# Patient Record
Sex: Male | Born: 1945 | Race: White | Hispanic: No | Marital: Single | Smoking: Former smoker
Health system: Southern US, Community
[De-identification: ages and names within clinical notes are randomized; demographics above are authoritative.]

## PROBLEM LIST (undated history)

## (undated) DIAGNOSIS — K219 Gastro-esophageal reflux disease without esophagitis: Secondary | ICD-10-CM

## (undated) DIAGNOSIS — R0600 Dyspnea, unspecified: Secondary | ICD-10-CM

## (undated) DIAGNOSIS — I10 Essential (primary) hypertension: Secondary | ICD-10-CM

## (undated) DIAGNOSIS — E785 Hyperlipidemia, unspecified: Secondary | ICD-10-CM

## (undated) DIAGNOSIS — R011 Cardiac murmur, unspecified: Secondary | ICD-10-CM

## (undated) DIAGNOSIS — E782 Mixed hyperlipidemia: Secondary | ICD-10-CM

## (undated) DIAGNOSIS — R0683 Snoring: Secondary | ICD-10-CM

## (undated) DIAGNOSIS — J45909 Unspecified asthma, uncomplicated: Secondary | ICD-10-CM

## (undated) DIAGNOSIS — T7840XA Allergy, unspecified, initial encounter: Secondary | ICD-10-CM

## (undated) DIAGNOSIS — R0602 Shortness of breath: Secondary | ICD-10-CM

## (undated) DIAGNOSIS — I6529 Occlusion and stenosis of unspecified carotid artery: Secondary | ICD-10-CM

## (undated) DIAGNOSIS — H9319 Tinnitus, unspecified ear: Secondary | ICD-10-CM

## (undated) HISTORY — DX: Essential (primary) hypertension: I10

## (undated) HISTORY — DX: Hyperlipidemia, unspecified: E78.5

## (undated) HISTORY — DX: Dyspnea, unspecified: R06.00

## (undated) HISTORY — DX: Shortness of breath: R06.02

## (undated) HISTORY — PX: CARDIAC CATHETERIZATION: SHX172

## (undated) HISTORY — DX: Allergy, unspecified, initial encounter: T78.40XA

## (undated) HISTORY — DX: Tinnitus, unspecified ear: H93.19

## (undated) HISTORY — DX: Snoring: R06.83

## (undated) HISTORY — DX: Mixed hyperlipidemia: E78.2

## (undated) HISTORY — PX: NO PAST SURGERIES: SHX2092

## (undated) HISTORY — DX: Occlusion and stenosis of unspecified carotid artery: I65.29

---

## 2015-11-01 DIAGNOSIS — R030 Elevated blood-pressure reading, without diagnosis of hypertension: Secondary | ICD-10-CM | POA: Insufficient documentation

## 2016-02-01 DIAGNOSIS — M109 Gout, unspecified: Secondary | ICD-10-CM | POA: Insufficient documentation

## 2017-04-01 DIAGNOSIS — G609 Hereditary and idiopathic neuropathy, unspecified: Secondary | ICD-10-CM | POA: Insufficient documentation

## 2017-06-12 DIAGNOSIS — R600 Localized edema: Secondary | ICD-10-CM | POA: Insufficient documentation

## 2019-12-23 DIAGNOSIS — E785 Hyperlipidemia, unspecified: Secondary | ICD-10-CM | POA: Insufficient documentation

## 2019-12-23 DIAGNOSIS — E782 Mixed hyperlipidemia: Secondary | ICD-10-CM | POA: Insufficient documentation

## 2019-12-23 DIAGNOSIS — H9319 Tinnitus, unspecified ear: Secondary | ICD-10-CM | POA: Insufficient documentation

## 2019-12-23 DIAGNOSIS — R0683 Snoring: Secondary | ICD-10-CM | POA: Insufficient documentation

## 2019-12-23 DIAGNOSIS — R0602 Shortness of breath: Secondary | ICD-10-CM | POA: Insufficient documentation

## 2019-12-23 DIAGNOSIS — R0600 Dyspnea, unspecified: Secondary | ICD-10-CM | POA: Insufficient documentation

## 2019-12-23 DIAGNOSIS — I10 Essential (primary) hypertension: Secondary | ICD-10-CM | POA: Insufficient documentation

## 2019-12-28 ENCOUNTER — Other Ambulatory Visit: Payer: Self-pay

## 2019-12-28 ENCOUNTER — Other Ambulatory Visit: Payer: Self-pay | Admitting: *Deleted

## 2019-12-28 ENCOUNTER — Ambulatory Visit: Payer: Medicare PPO | Admitting: Cardiology

## 2019-12-28 ENCOUNTER — Encounter: Payer: Self-pay | Admitting: Cardiology

## 2019-12-28 VITALS — BP 104/62 | HR 85 | Ht 69.0 in | Wt 247.0 lb

## 2019-12-28 DIAGNOSIS — I10 Essential (primary) hypertension: Secondary | ICD-10-CM

## 2019-12-28 DIAGNOSIS — R0600 Dyspnea, unspecified: Secondary | ICD-10-CM | POA: Diagnosis not present

## 2019-12-28 DIAGNOSIS — R0989 Other specified symptoms and signs involving the circulatory and respiratory systems: Secondary | ICD-10-CM

## 2019-12-28 DIAGNOSIS — R079 Chest pain, unspecified: Secondary | ICD-10-CM

## 2019-12-28 DIAGNOSIS — I200 Unstable angina: Secondary | ICD-10-CM

## 2019-12-28 DIAGNOSIS — R011 Cardiac murmur, unspecified: Secondary | ICD-10-CM

## 2019-12-28 DIAGNOSIS — E782 Mixed hyperlipidemia: Secondary | ICD-10-CM

## 2019-12-28 DIAGNOSIS — R0602 Shortness of breath: Secondary | ICD-10-CM

## 2019-12-28 DIAGNOSIS — R0609 Other forms of dyspnea: Secondary | ICD-10-CM

## 2019-12-28 HISTORY — DX: Other specified symptoms and signs involving the circulatory and respiratory systems: R09.89

## 2019-12-28 HISTORY — DX: Chest pain, unspecified: R07.9

## 2019-12-28 MED ORDER — METOPROLOL TARTRATE 100 MG PO TABS: ORAL_TABLET | ORAL | 0 refills | Status: DC

## 2019-12-28 NOTE — Patient Instructions (Addendum)
Medication Instructions:  Your physician recommends that you continue on your current medications as directed. Please refer to the Current Medication list given to you today.  *If you need a refill on your cardiac medications before your next appointment, please call your pharmacy*   Lab Work: When they call you with a date for your Cardiac Ct, you will need to come to the office at least 3 days prior to your CT for a BMET   If you have labs (blood work) drawn today and your tests are completely normal, you will receive your results only by: Marland Kitchen MyChart Message (if you have MyChart) OR . A paper copy in the mail If you have any lab test that is abnormal or we need to change your treatment, we will call you to review the results.   Testing/Procedures: Your physician has requested that you have an echocardiogram. Echocardiography is a painless test that uses sound waves to create images of your heart. It provides your doctor with information about the size and shape of your heart and how well your heart's chambers and valves are working. This procedure takes approximately one hour. There are no restrictions for this procedure.  Your physician has requested that you have a carotid duplex. This test is an ultrasound of the carotid arteries in your neck. It looks at blood flow through these arteries that supply the brain with blood. Allow one hour for this exam. There are no restrictions or special instructions.  Your physician has requested that you have cardiac CT. Cardiac computed tomography (CT) is a painless test that uses an x-ray machine to take clear, detailed pictures of your heart. For further information please visit [email or web address]. Please follow instruction sheet as given.    Your cardiac CT will be scheduled at one of the below locations:   [facility] [address] [place], [place] [address] [phone]   At [facility], please arrive at the [facility] main entrance of [facility] 30 minutes prior to test start time. Proceed to the [facility] Radiology Department (first floor) to check-in and test prep.   Please follow these instructions carefully (unless otherwise directed):  Hold all erectile dysfunction medications at least 3 days (72 hrs) prior to test.  On the Night Before the Test: . Be sure to Drink plenty of water. . Do not consume any caffeinated/decaffeinated beverages or chocolate 12 hours prior to your test. . Do not take any antihistamines 12 hours prior to your test.   On the Day of the Test: . Drink plenty of water. Do not drink any water within one hour of the test. . Do not eat any food 4 hours prior to the test. . You may take your regular medications prior to the test.  . Take metoprolol (Lopressor)100 mg tablet two hours prior to test (this has been sent to [facility] for you to pick up . HOLD Hydrochlorothiazide morning of the test.       After the Test: . Drink plenty of water. . After receiving IV contrast, you may experience a mild flushed feeling. This is normal. . On occasion, you may experience a mild rash up to 24 hours after the test. This is not dangerous. If this occurs, you can take Benadryl 25 mg and increase your fluid intake. . If you experience trouble breathing, this can be serious. If it is severe call 911 IMMEDIATELY. If it is mild, please call our office. . If  you take any of these medications: Glipizide/Metformin, Avandament, Glucavance, please do not take 48 hours after completing test unless otherwise instructed.   Once we have confirmed authorization from your insurance company, we will call you to set up a date and time for your test. Based on how quickly your insurance processes prior authorizations requests, please allow up to 4 weeks to be contacted for scheduling your Cardiac CT appointment. Be advised that routine Cardiac CT appointments could be scheduled as many as 8 weeks  after your provider has ordered it.  For non-scheduling related questions, please contact the cardiac imaging nurse navigator should you have any questions/concerns: Marchia Bond, Cardiac Imaging Nurse Navigator Burley Saver, Interim Cardiac Imaging Nurse [place] [facility] Direct Office Dial: [phone]   For scheduling needs, including cancellations and rescheduling, please call Vivien Rota at [phone], option 3.  Follow-Up: At [facility], you and your health needs are our priority.  As part of our continuing mission to provide you with exceptional heart care, we have created designated Provider Care Teams.  These Care Teams include your primary Cardiologist (physician) and Advanced Practice Providers (APPs -  Physician Assistants and Nurse Practitioners) who all work together to provide you with the care you need, when you need it.  We recommend signing up for the patient portal called "MyChart".  Sign up information is provided on this After Visit Summary.  MyChart is used to connect with patients for Virtual Visits (Telemedicine).  Patients are able to view lab/test results, encounter notes, upcoming appointments, etc.  Non-urgent messages can be sent to your provider as well.   To learn more about what you can do with MyChart, go to [email or web address].    Your next appointment:   6 week(s)  The format for your next appointment:   In Person  Provider:   Jenne Campus, MD   Other Instructions  Echocardiogram An echocardiogram is a procedure that uses painless sound waves (ultrasound) to produce an image of the heart. Images from an echocardiogram can provide important information about:  Signs of coronary artery disease (CAD).  Aneurysm detection. An aneurysm is a weak or damaged part of an artery wall that bulges out from the normal force of blood pumping through the body.  Heart size and shape. Changes in the size or shape of the heart  can be associated with certain conditions, including heart failure, aneurysm, and CAD.  Heart muscle function.  Heart valve function.  Signs of a past heart attack.  Fluid buildup around the heart.  Thickening of the heart muscle.  A tumor or infectious growth around the heart valves. Tell a health care provider about:  Any allergies you have.  All medicines you are taking, including vitamins, herbs, eye drops, creams, and over-the-counter medicines.  Any blood disorders you have.  Any surgeries you have had.  Any medical conditions you have.  Whether you are pregnant or may be pregnant. What are the risks? Generally, this is a safe procedure. However, problems may occur, including:  Allergic reaction to dye (contrast) that may be used during the procedure. What happens before the procedure? No specific preparation is needed. You may eat and drink normally. What happens during the procedure?   An IV tube may be inserted into one of your veins.  You may receive contrast through this tube. A contrast is an injection that improves the quality of the pictures from your heart.  A gel will be applied to your chest.  A wand-like tool (transducer) will be moved over your chest. The gel will help to transmit the sound waves from the transducer.  The sound waves will harmlessly bounce off of your heart to allow the heart images to be captured in real-time motion. The images will be recorded on a computer. The procedure may vary among health care providers and hospitals. What happens after the procedure?  You may return to your normal, everyday life, including diet, activities, and medicines, unless your health care provider tells you not to do that. Summary  An echocardiogram is a procedure that uses painless sound waves (ultrasound) to produce an image of the heart.  Images from an echocardiogram can provide important information about the size and shape of your heart,  heart muscle function, heart valve function, and fluid buildup around your heart.  You do not need to do anything to prepare before this procedure. You may eat and drink normally.  After the echocardiogram is completed, you may return to your normal, everyday life, unless your health care provider tells you not to do that. This information is not intended to replace advice given to you by your health care provider. Make sure you discuss any questions you have with your health care provider. Document Revised: 08/14/2018 Document Reviewed: 05/26/2016 Elsevier Patient Education  [place].

## 2019-12-28 NOTE — Progress Notes (Signed)
Cardiology Consultation:    Date:  12/28/2019   ID:  Russell Butler 29-Nov-1945, MRN [id number]  PCP:  [place], Sarah T, MD  Cardiologist:  Gypsy Balsam, MD   Referring MD: [place], Sarah T, MD   Chief Complaint  Patient presents with  . Shortness of Breath    Saw chodrey  . Chest Pain    History of Present Illness:    Russell Butler is a 74 y.o. male who is being seen today for the evaluation of chest pain at the request of [place], Sarah T, MD. For what 6 months he experienced fatigue tiredness and shortness of breath. He said anytime he goes to mailbox and comes back he gets significantly short of breath, also sometimes when he walks uphill he will develop some uneasy sensation in the chest he sits in the chair and a few minutes later the sensation is gone. He does have history of essential hypertension, also many years ago he was evaluated by Dr.Dhatt who sent him to [facility] where he had cardiac catheterization was told everything is fine. Risk factors for coronary artery disease include dyslipidemia, essential hypertension. He smoked but quit 40 years ago No family history of premature coronary disease  Past Medical History:  Diagnosis Date  . Dyspnea   . Essential hypertension   . Hyperlipidemia   . Hypertension   . Mixed hyperlipidemia   . Snoring   . SOB (shortness of breath)   . Tinnitus     Past Surgical History:  Procedure Laterality Date  . NO PAST SURGERIES      Current Medications: Current Meds  Medication Sig  . albuterol (VENTOLIN HFA) 108 (90 Base) MCG/ACT inhaler   . atorvastatin (LIPITOR) 20 MG tablet Take 20 mg by mouth daily.  Marland Kitchen BREO ELLIPTA 100-25 MCG/INH AEPB   . hydrochlorothiazide (HYDRODIURIL) 25 MG tablet Take 25 mg by mouth daily.  Marland Kitchen lisinopril (ZESTRIL) 40 MG tablet Take 40 mg by mouth daily.     Allergies:   Patient has no known allergies.   Social History   Socioeconomic History  . Marital status: Unknown    Spouse  name: Not on file  . Number of children: Not on file  . Years of education: Not on file  . Highest education level: Not on file  Occupational History  . Not on file  Tobacco Use  . Smoking status: Former Smoker    Types: Cigarettes    Quit date: 12/28/1979    Years since quitting: 40.0  . Smokeless tobacco: Never Used  Substance and Sexual Activity  . Alcohol use: Yes    Alcohol/week: 4.0 standard drinks    Types: 4 Glasses of wine per week  . Drug use: Never  . Sexual activity: Not on file  Other Topics Concern  . Not on file  Social History Narrative  . Not on file   Social Determinants of Health   Financial Resource Strain:   . Difficulty of Paying Living Expenses: Not on file  Food Insecurity:   . Worried About Programme researcher, broadcasting/film/video in the Last Year: Not on file  . Ran Out of Food in the Last Year: Not on file  Transportation Needs:   . Lack of Transportation (Medical): Not on file  . Lack of Transportation (Non-Medical): Not on file  Physical Activity:   . Days of Exercise per Week: Not on file  . Minutes of Exercise per Session: Not on file  Stress:   .  Feeling of Stress : Not on file  Social Connections:   . Frequency of Communication with Friends and Family: Not on file  . Frequency of Social Gatherings with Friends and Family: Not on file  . Attends Religious Services: Not on file  . Active Member of Clubs or Organizations: Not on file  . Attends Banker Meetings: Not on file  . Marital Status: Not on file     Family History: The patient's family history includes Cancer in his paternal grandfather; Depression in his mother; Diabetes in his maternal grandfather; Hypercholesterolemia in his brother; Hypertension in his brother; Liver disease in his father. ROS:   Please see the history of present illness.    All 14 point review of systems negative except as described per history of present illness.  EKGs/Labs/Other Studies Reviewed:    The  following studies were reviewed today:   EKG:  EKG is  ordered today.  The ekg ordered today demonstrates normal sinus rhythm, normal P interval, nonspecific ST segment changes in multiple leads. He is asymptomatic at the moment  Recent Labs: No results found for requested labs within last 8760 hours.  Recent Lipid Panel No results found for: CHOL, TRIG, HDL, CHOLHDL, VLDL, LDLCALC, LDLDIRECT  Physical Exam:    VS:  BP 104/62 (BP Location: Left Arm, Patient Position: Sitting, Cuff Size: Large)   Pulse 85   Ht 5\' 9"  (1.753 m)   Wt 247 lb (112 kg)   SpO2 98%   BMI 36.48 kg/m     Wt Readings from Last 3 Encounters:  12/28/19 247 lb (112 kg)     GEN:  Well nourished, well developed in no acute distress HEENT: Normal NECK: No JVD; bilateral carotic bruit LYMPHATICS: No lymphadenopathy CARDIAC: RRR, systolic ejection murmur mid peaking grade 2/6 best heard right upper portion of the sternum with radiation towards the neck, no rubs, no gallops RESPIRATORY:  Clear to auscultation without rales, wheezing or rhonchi  ABDOMEN: Soft, non-tender, non-distended MUSCULOSKELETAL:  No edema; No deformity  SKIN: Warm and dry NEUROLOGIC:  Alert and oriented x 3 PSYCHIATRIC:  Normal affect   ASSESSMENT:    1. Essential hypertension   2. Dyspnea on exertion   3. Mixed hyperlipidemia   4. SOB (shortness of breath)   5. Unstable angina pectoris (HCC)   6. Bruit   7. Chest pain of uncertain etiology   8. Heart murmur    PLAN:    In order of problems listed above:  1. Dyspnea on exertion with some chest pain obviously very concerning. Initial impression was that he may be having significant aortic stenosis, I did quick look with echocardiogram luckily that is not the case. Therefore, we need to evaluate him for coronary artery disease. I think the best approach will be to do coronary CT angio. I will also schedule him to have full echocardiogram to make sure we have specific assessment of  his aortic valve as well as overall left ventricle ejection fraction.  He does have nitroglycerin that he never used but he knows how to use it also told him if nitroglycerin does not help with the pain he is to go to the emergency room. 2. Bilateral carotic bruit carotic ultrasound will be done to clarify that. 3. Mixed dyslipidemia he is on moderate intensity statin which I will continue for now however future recommendation can change. 4.  Aortic stenosis, appears to be not critical based on quick look with echocardiogram, full assessment will  be done with full echocardiogram.   Medication Adjustments/Labs and Tests Ordered: Current medicines are reviewed at length with the patient today.  Concerns regarding medicines are outlined above.  Orders Placed This Encounter  Procedures  . CT CORONARY MORPH W/CTA COR W/SCORE W/CA W/CM &/OR WO/CM  . CT CORONARY FRACTIONAL FLOW RESERVE DATA PREP  . CT CORONARY FRACTIONAL FLOW RESERVE FLUID ANALYSIS  . EKG 12-Lead  . VAS US CAROTID   No orders of the defined types were placed in this encounter.   Signed, Georgeanna Lea, MD, [facility]. 12/28/2019 3:01 PM    [facility]

## 2020-01-18 ENCOUNTER — Other Ambulatory Visit: Payer: Self-pay

## 2020-01-18 ENCOUNTER — Ambulatory Visit (INDEPENDENT_AMBULATORY_CARE_PROVIDER_SITE_OTHER): Payer: Medicare PPO

## 2020-01-18 DIAGNOSIS — R0989 Other specified symptoms and signs involving the circulatory and respiratory systems: Secondary | ICD-10-CM

## 2020-01-18 DIAGNOSIS — R011 Cardiac murmur, unspecified: Secondary | ICD-10-CM | POA: Diagnosis not present

## 2020-01-18 LAB — ECHOCARDIOGRAM COMPLETE
AR max vel: 1.83 cm2
AV Area VTI: 2.1 cm2
AV Area mean vel: 1.91 cm2
AV Mean grad: 11 mmHg
AV Peak grad: 20.6 mmHg
Ao pk vel: 2.27 m/s
Area-P 1/2: 3.08 cm2
S' Lateral: 3.7 cm

## 2020-01-18 NOTE — Progress Notes (Signed)
Complete echocardiogram performed.  Jimmy Virdia Ziesmer RDCS, RVT  

## 2020-01-18 NOTE — Progress Notes (Addendum)
Complete Carotid duplex exam performed.  Jimmy Markeith Jue RDCS, RVT

## 2020-01-27 ENCOUNTER — Telehealth: Payer: Self-pay | Admitting: Cardiology

## 2020-01-27 NOTE — Telephone Encounter (Signed)
Patient stopped by asking about CT scan. Stated he has received an Multimedia programmer from his insurance. Needs to be scheduled for CT. I do not see the auth in the patient chart.

## 2020-02-24 ENCOUNTER — Other Ambulatory Visit: Payer: Self-pay

## 2020-02-24 ENCOUNTER — Encounter: Payer: Self-pay | Admitting: Cardiology

## 2020-02-24 ENCOUNTER — Ambulatory Visit: Payer: Medicare PPO | Admitting: Cardiology

## 2020-02-24 VITALS — BP 106/70 | HR 70 | Ht 68.0 in | Wt 243.4 lb

## 2020-02-24 DIAGNOSIS — R0989 Other specified symptoms and signs involving the circulatory and respiratory systems: Secondary | ICD-10-CM | POA: Diagnosis not present

## 2020-02-24 DIAGNOSIS — R0609 Other forms of dyspnea: Secondary | ICD-10-CM

## 2020-02-24 DIAGNOSIS — R0600 Dyspnea, unspecified: Secondary | ICD-10-CM

## 2020-02-24 DIAGNOSIS — I10 Essential (primary) hypertension: Secondary | ICD-10-CM | POA: Diagnosis not present

## 2020-02-24 DIAGNOSIS — R079 Chest pain, unspecified: Secondary | ICD-10-CM

## 2020-02-24 DIAGNOSIS — E782 Mixed hyperlipidemia: Secondary | ICD-10-CM | POA: Diagnosis not present

## 2020-02-24 DIAGNOSIS — I739 Peripheral vascular disease, unspecified: Secondary | ICD-10-CM

## 2020-02-24 HISTORY — DX: Peripheral vascular disease, unspecified: I73.9

## 2020-02-24 NOTE — Progress Notes (Signed)
Cardiology Office Note:    Date:  02/24/2020   ID:  Russell Butler 1945/07/02, MRN [id number]  PCP:  [place], Sarah T, MD  Cardiologist:  Gypsy Balsam, MD    Referring MD: [place], Sarah T, MD   No chief complaint on file. I am doing the same  History of Present Illness:    Russell Butler is a 74 y.o. male with past medical history significant for peripheral vascular disease, recent carotic ultrasound showed up to 79% stenosis of both sides, essential hypertension, dyslipidemia, was referred to [place] initially because of dyspnea on exertion on top of that sometimes when he exercise walk he will develop some uneasy sensation in the chest.  Plan was to schedule to have coronary CT angio, however, there is delay by approval by insurance company and we still waiting for the test.  Overall he said he is doing the same.  Recently addition of hydrochlorothiazide has been made to his medical regimen secondary to the fact he was hypertensive.  Blood pressure seems to be well controlled with now.  Denies having atypical chest pain tightness squeezing pressure burning chest but shortness of breath and fatigue is still present.  Past Medical History:  Diagnosis Date  . Dyspnea   . Essential hypertension   . Hyperlipidemia   . Hypertension   . Mixed hyperlipidemia   . Snoring   . SOB (shortness of breath)   . Tinnitus     Past Surgical History:  Procedure Laterality Date  . NO PAST SURGERIES      Current Medications: Current Meds  Medication Sig  . albuterol (VENTOLIN HFA) 108 (90 Base) MCG/ACT inhaler   . aspirin EC 81 MG tablet Take 81 mg by mouth daily. Swallow whole.  Marland Kitchen atorvastatin (LIPITOR) 20 MG tablet Take 20 mg by mouth daily.  Marland Kitchen BREO ELLIPTA 100-25 MCG/INH AEPB   . hydrochlorothiazide (HYDRODIURIL) 25 MG tablet Take 25 mg by mouth daily.  Marland Kitchen lisinopril (ZESTRIL) 40 MG tablet Take 40 mg by mouth daily.  . metoprolol tartrate (LOPRESSOR) 100 MG tablet Take 1 tablet by mouth  2 hour prior to your Cardiac Ct     Allergies:   Patient has no known allergies.   Social History   Socioeconomic History  . Marital status: Unknown    Spouse name: Not on file  . Number of children: Not on file  . Years of education: Not on file  . Highest education level: Not on file  Occupational History  . Not on file  Tobacco Use  . Smoking status: Former Smoker    Types: Cigarettes    Quit date: 12/28/1979    Years since quitting: 40.1  . Smokeless tobacco: Never Used  Substance and Sexual Activity  . Alcohol use: Yes    Alcohol/week: 4.0 standard drinks    Types: 4 Glasses of wine per week  . Drug use: Never  . Sexual activity: Not on file  Other Topics Concern  . Not on file  Social History Narrative  . Not on file   Social Determinants of Health   Financial Resource Strain:   . Difficulty of Paying Living Expenses: Not on file  Food Insecurity:   . Worried About Programme researcher, broadcasting/film/video in the Last Year: Not on file  . Ran Out of Food in the Last Year: Not on file  Transportation Needs:   . Lack of Transportation (Medical): Not on file  . Lack of Transportation (Non-Medical): Not on file  Physical Activity:   . Days of Exercise per Week: Not on file  . Minutes of Exercise per Session: Not on file  Stress:   . Feeling of Stress : Not on file  Social Connections:   . Frequency of Communication with Friends and Family: Not on file  . Frequency of Social Gatherings with Friends and Family: Not on file  . Attends Religious Services: Not on file  . Active Member of Clubs or Organizations: Not on file  . Attends Banker Meetings: Not on file  . Marital Status: Not on file     Family History: The patient's family history includes Cancer in his paternal grandfather; Depression in his mother; Diabetes in his maternal grandfather; Hypercholesterolemia in his brother; Hypertension in his brother; Liver disease in his father. ROS:   Please see the history  of present illness.    All 14 point review of systems negative except as described per history of present illness  EKGs/Labs/Other Studies Reviewed:      Recent Labs: No results found for requested labs within last 8760 hours.  Recent Lipid Panel No results found for: CHOL, TRIG, HDL, CHOLHDL, VLDL, LDLCALC, LDLDIRECT  Physical Exam:    VS:  BP 106/70   Pulse 70   Ht 5\' 8"  (1.727 m)   Wt 243 lb 6.4 oz (110.4 kg)   SpO2 98%   BMI 37.01 kg/m     Wt Readings from Last 3 Encounters:  02/24/20 243 lb 6.4 oz (110.4 kg)  12/28/19 247 lb (112 kg)     GEN:  Well nourished, well developed in no acute distress HEENT: Normal NECK: No JVD; No carotid bruits LYMPHATICS: No lymphadenopathy CARDIAC: RRR, no murmurs, no rubs, no gallops RESPIRATORY:  Clear to auscultation without rales, wheezing or rhonchi  ABDOMEN: Soft, non-tender, non-distended MUSCULOSKELETAL:  No edema; No deformity  SKIN: Warm and dry LOWER EXTREMITIES: no swelling NEUROLOGIC:  Alert and oriented x 3 PSYCHIATRIC:  Normal affect   ASSESSMENT:    1. Mixed hyperlipidemia   2. Essential hypertension   3. Dyspnea on exertion   4. Bilateral carotid bruits   5. Chest pain of uncertain etiology   6. Peripheral vascular disease, unspecified (HCC) up to 79% p both sides carotids    PLAN:    In order of problems listed above:  1. Mixed dyslipidemia I will check his fasting lipid profile today.  He does have peripheral vascular disease he need to have good reduction of cholesterol which we will check. 2. Essential hypertension: That seems to be stable I will continue present management. 3. Dyspnea on exertion: Echocardiogram showed preserved left ventricle ejection fraction, mild aortic valve stenosis. 4. Bilateral carotic bruit again carotic ultrasounds revealed stenosis up to 79% both sides.  He is on aspirin which I will continue will maximize his statin therapy but first we will check his fasting lipid  profile. 5. Awaiting evaluation for coronary artery disease, finally, we have approval by insurance company we will schedule him to have coronary CT.   Medication Adjustments/Labs and Tests Ordered: Current medicines are reviewed at length with the patient today.  Concerns regarding medicines are outlined above.  Orders Placed This Encounter  Procedures  . Lipid panel   Medication changes: No orders of the defined types were placed in this encounter.   Signed, 12/30/19, MD, [facility] 02/24/2020 2:57 PM    [facility]

## 2020-02-24 NOTE — Patient Instructions (Signed)
Medication Instructions:  Your physician recommends that you continue on your current medications as directed. Please refer to the Current Medication list given to you today.  *If you need a refill on your cardiac medications before your next appointment, please call your pharmacy*   Lab Work: Your physician recommends that you return for lab work in: lipid   If you have labs (blood work) drawn today and your tests are completely normal, you will receive your results only by: Marland Kitchen MyChart Message (if you have MyChart) OR . A paper copy in the mail If you have any lab test that is abnormal or we need to change your treatment, we will call you to review the results.   Testing/Procedures: None   Follow-Up: At [facility], you and your health needs are our priority.  As part of our continuing mission to provide you with exceptional heart care, we have created designated Provider Care Teams.  These Care Teams include your primary Cardiologist (physician) and Advanced Practice Providers (APPs -  Physician Assistants and Nurse Practitioners) who all work together to provide you with the care you need, when you need it.  We recommend signing up for the patient portal called "MyChart".  Sign up information is provided on this After Visit Summary.  MyChart is used to connect with patients for Virtual Visits (Telemedicine).  Patients are able to view lab/test results, encounter notes, upcoming appointments, etc.  Non-urgent messages can be sent to your provider as well.   To learn more about what you can do with MyChart, go to [email or web address].    Your next appointment:   2 month(s)  The format for your next appointment:   In Person  Provider:   Gypsy Balsam, MD   Other Instructions

## 2020-02-25 ENCOUNTER — Telehealth: Payer: Self-pay | Admitting: Emergency Medicine

## 2020-02-25 DIAGNOSIS — E782 Mixed hyperlipidemia: Secondary | ICD-10-CM

## 2020-02-25 LAB — LIPID PANEL
Chol/HDL Ratio: 3.4 ratio (ref 0.0–5.0)
Cholesterol, Total: 172 mg/dL (ref 100–199)
HDL: 51 mg/dL (ref >39)
LDL Chol Calc (NIH): 91 mg/dL (ref 0–99)
Triglycerides: 172 mg/dL — ABNORMAL HIGH (ref 0–149)
VLDL Cholesterol Cal: 30 mg/dL (ref 5–40)

## 2020-02-25 MED ORDER — ATORVASTATIN CALCIUM 40 MG PO TABS
40.0000 mg | ORAL_TABLET | Freq: Every day | ORAL | 1 refills | Status: DC
Start: 2020-02-25 — End: 2020-03-24

## 2020-02-25 NOTE — Telephone Encounter (Signed)
-----   Message from Georgeanna Lea, MD sent at 02/25/2020 12:33 PM EDT ----- Increase the dose of Lipitor from 20 to 40 mg daily, fasting lipid profile AST ALT within the next 6 weeks

## 2020-02-25 NOTE — Telephone Encounter (Signed)
Called patient informed him of results and recommendations. He will increase lipitor to 40 mg and have labs repeated in 6 weeks.

## 2020-03-08 ENCOUNTER — Other Ambulatory Visit: Payer: Self-pay | Admitting: Cardiology

## 2020-03-09 ENCOUNTER — Ambulatory Visit (HOSPITAL_COMMUNITY): Payer: Medicare PPO

## 2020-03-09 LAB — BASIC METABOLIC PANEL
BUN/Creatinine Ratio: 13 (ref 10–24)
BUN: 18 mg/dL (ref 8–27)
CO2: 25 mmol/L (ref 20–29)
Calcium: 9.8 mg/dL (ref 8.6–10.2)
Chloride: 101 mmol/L (ref 96–106)
Creatinine, Ser: 1.36 mg/dL — ABNORMAL HIGH (ref 0.76–1.27)
GFR calc Af Amer: 59 mL/min/{1.73_m2} — ABNORMAL LOW (ref >59)
GFR calc non Af Amer: 51 mL/min/{1.73_m2} — ABNORMAL LOW (ref >59)
Glucose: 102 mg/dL — ABNORMAL HIGH (ref 65–99)
Potassium: 4.4 mmol/L (ref 3.5–5.2)
Sodium: 138 mmol/L (ref 134–144)

## 2020-03-10 ENCOUNTER — Telehealth (HOSPITAL_COMMUNITY): Payer: Self-pay | Admitting: Emergency Medicine

## 2020-03-10 NOTE — Telephone Encounter (Signed)
Reaching out to patient to offer assistance regarding upcoming cardiac imaging study; pt verbalizes understanding of appt date/time, parking situation and where to check in, pre-test NPO status and medications ordered, and verified current allergies; name and call back number provided for further questions should they arise Jamier Urbas RN Navigator Cardiac Imaging [facility] [phone] office [phone] cell 

## 2020-03-14 ENCOUNTER — Other Ambulatory Visit: Payer: Self-pay

## 2020-03-14 ENCOUNTER — Ambulatory Visit (HOSPITAL_COMMUNITY)
Admission: RE | Admit: 2020-03-14 | Discharge: 2020-03-14 | Disposition: A | Discharge status: Home / Self Care | Payer: Medicare PPO | Source: Ambulatory Visit | Attending: Cardiology | Admitting: Cardiology

## 2020-03-14 ENCOUNTER — Encounter (HOSPITAL_COMMUNITY): Payer: Self-pay

## 2020-03-14 DIAGNOSIS — I2510 Atherosclerotic heart disease of native coronary artery without angina pectoris: Secondary | ICD-10-CM | POA: Diagnosis not present

## 2020-03-14 DIAGNOSIS — I200 Unstable angina: Secondary | ICD-10-CM | POA: Diagnosis present

## 2020-03-14 MED ORDER — IOHEXOL 350 MG/ML SOLN
100.0000 mL | Freq: Once | INTRAVENOUS | Status: AC | PRN
  Administered 2020-03-14: 100 mL via INTRAVENOUS

## 2020-03-14 MED ORDER — NITROGLYCERIN 0.4 MG SL SUBL
SUBLINGUAL_TABLET | SUBLINGUAL | Status: AC
  Filled 2020-03-14: qty 2

## 2020-03-14 MED ORDER — NITROGLYCERIN 0.4 MG SL SUBL
0.8000 mg | SUBLINGUAL_TABLET | Freq: Once | SUBLINGUAL | Status: AC
  Administered 2020-03-14: 0.8 mg via SUBLINGUAL

## 2020-03-15 DIAGNOSIS — I2510 Atherosclerotic heart disease of native coronary artery without angina pectoris: Secondary | ICD-10-CM | POA: Diagnosis not present

## 2020-03-24 ENCOUNTER — Other Ambulatory Visit: Payer: Self-pay

## 2020-03-24 ENCOUNTER — Ambulatory Visit: Payer: Medicare PPO | Admitting: Cardiology

## 2020-03-24 ENCOUNTER — Encounter: Payer: Self-pay | Admitting: Cardiology

## 2020-03-24 VITALS — BP 120/70 | HR 76 | Ht 68.0 in | Wt 242.0 lb

## 2020-03-24 DIAGNOSIS — I2510 Atherosclerotic heart disease of native coronary artery without angina pectoris: Secondary | ICD-10-CM

## 2020-03-24 DIAGNOSIS — I739 Peripheral vascular disease, unspecified: Secondary | ICD-10-CM | POA: Diagnosis not present

## 2020-03-24 DIAGNOSIS — E782 Mixed hyperlipidemia: Secondary | ICD-10-CM | POA: Diagnosis not present

## 2020-03-24 HISTORY — DX: Atherosclerotic heart disease of native coronary artery without angina pectoris: I25.10

## 2020-03-24 MED ORDER — ATORVASTATIN CALCIUM 80 MG PO TABS
80.0000 mg | ORAL_TABLET | Freq: Every day | ORAL | 3 refills | Status: DC
Start: 2020-03-24 — End: 2021-03-01

## 2020-03-24 MED ORDER — RANOLAZINE ER 500 MG PO TB12: 500.0000 mg | ORAL_TABLET | Freq: Two times a day (BID) | ORAL | 3 refills | Status: DC

## 2020-03-24 NOTE — Addendum Note (Signed)
Addended by: Vernard Gambles on: 03/24/2020 03:38 PM   Modules accepted: Orders

## 2020-03-24 NOTE — Progress Notes (Signed)
Cardiology Office Note:    Date:  03/24/2020   ID:  Russell Butler, DOB March 04, 1946, MRN [id number]  PCP:  [place], Sarah T, MD  Cardiologist:  Russell Balsam, MD    Referring MD: [place], Sarah T, MD   Chief Complaint  Patient presents with  . Follow-up  I am here to discuss test  History of Present Illness:    Russell Butler is a 74 y.o. male with past medical history significant for peripheral vascular disease, he was find to have up to 79% stenosis on both sides of his coronary arteries, essential hypertension, dyslipidemia, he did have coronary CT angio which showed distal disease.  Fractional flow reserve showed significant stenosis involving distal diagonal 1 distal left circumflex and distal PDA.  Lately he does not have much symptoms he gets short of breath while walking but no chest pain tightness squeezing pressure burning chest.  We spent a great deal of time talking about options for the situation option being cardiac catheterization versus medical therapy.  Since studies suggest distal disease on top of that he does not have typical symptoms we elected to start with medical therapy.  Past Medical History:  Diagnosis Date  . Carotid bruit 12/28/2019  . Chest pain of uncertain etiology 12/28/2019  . Dyspnea   . Essential hypertension   . Hyperlipidemia   . Hypertension   . Mixed hyperlipidemia   . Peripheral vascular disease, unspecified (HCC) up to 79% p both sides carotids 02/24/2020  . Snoring   . SOB (shortness of breath)   . Tinnitus     Past Surgical History:  Procedure Laterality Date  . NO PAST SURGERIES      Current Medications: Current Meds  Medication Sig  . albuterol (VENTOLIN HFA) 108 (90 Base) MCG/ACT inhaler   . aspirin EC 81 MG tablet Take 81 mg by mouth daily. Swallow whole.  Marland Kitchen atorvastatin (LIPITOR) 40 MG tablet Take 1 tablet (40 mg total) by mouth daily.  Marland Kitchen BREO ELLIPTA 100-25 MCG/INH AEPB   . hydrochlorothiazide (HYDRODIURIL) 25 MG tablet  Take 25 mg by mouth daily.  Marland Kitchen lisinopril (ZESTRIL) 40 MG tablet Take 40 mg by mouth daily.     Allergies:   Patient has no known allergies.   Social History   Socioeconomic History  . Marital status: Unknown    Spouse name: Not on file  . Number of children: Not on file  . Years of education: Not on file  . Highest education level: Not on file  Occupational History  . Not on file  Tobacco Use  . Smoking status: Former Smoker    Types: Cigarettes    Quit date: 12/28/1979    Years since quitting: 40.2  . Smokeless tobacco: Never Used  Substance and Sexual Activity  . Alcohol use: Yes    Alcohol/week: 4.0 standard drinks    Types: 4 Glasses of wine per week  . Drug use: Never  . Sexual activity: Not on file  Other Topics Concern  . Not on file  Social History Narrative  . Not on file   Social Determinants of Health   Financial Resource Strain:   . Difficulty of Paying Living Expenses: Not on file  Food Insecurity:   . Worried About Programme researcher, broadcasting/film/video in the Last Year: Not on file  . Ran Out of Food in the Last Year: Not on file  Transportation Needs:   . Lack of Transportation (Medical): Not on file  . Lack of Transportation (  Non-Medical): Not on file  Physical Activity:   . Days of Exercise per Week: Not on file  . Minutes of Exercise per Session: Not on file  Stress:   . Feeling of Stress : Not on file  Social Connections:   . Frequency of Communication with Friends and Family: Not on file  . Frequency of Social Gatherings with Friends and Family: Not on file  . Attends Religious Services: Not on file  . Active Member of Clubs or Organizations: Not on file  . Attends Banker Meetings: Not on file  . Marital Status: Not on file     Family History: The patient's family history includes Cancer in his paternal grandfather; Depression in his mother; Diabetes in his maternal grandfather; Hypercholesterolemia in his brother; Hypertension in his brother;  Liver disease in his father. ROS:   Please see the history of present illness.    All 14 point review of systems negative except as described per history of present illness  EKGs/Labs/Other Studies Reviewed:    Coronary CT angio with fractional flow reserve showed: 1. Left Main: findings 0.96, 0.95  2.      LAD: findings 0.95, 0.90;              D1: 0.90, 0.87, 0.63  3.      LCX: findings 0.95, 0.91 0.77;     OM2: 0.82  4.      RCA: findings 0.96, 0.94 0.86;     PDA: 0.74  IMPRESSION: Findings suggest significant stenosis in distal D1, distal Lcx and PDA.  Recent Labs: 03/08/2020: BUN 18; Creatinine, Ser 1.36; Potassium 4.4; Sodium 138  Recent Lipid Panel    Component Value Date/Time   CHOL 172 02/24/2020 1458   TRIG 172 (H) 02/24/2020 1458   HDL 51 02/24/2020 1458   CHOLHDL 3.4 02/24/2020 1458   LDLCALC 91 02/24/2020 1458    Physical Exam:    VS:  BP 120/70 (BP Location: Left Arm, Patient Position: Sitting, Cuff Size: Normal)   Pulse 76   Ht 5\' 8"  (1.727 m)   Wt 242 lb (109.8 kg)   SpO2 95%   BMI 36.80 kg/m     Wt Readings from Last 3 Encounters:  03/24/20 242 lb (109.8 kg)  02/24/20 243 lb 6.4 oz (110.4 kg)  12/28/19 247 lb (112 kg)     GEN:  Well nourished, well developed in no acute distress HEENT: Normal NECK: No JVD; No carotid bruits LYMPHATICS: No lymphadenopathy CARDIAC: RRR, no murmurs, no rubs, no gallops RESPIRATORY:  Clear to auscultation without rales, wheezing or rhonchi  ABDOMEN: Soft, non-tender, non-distended MUSCULOSKELETAL:  No edema; No deformity  SKIN: Warm and dry LOWER EXTREMITIES: no swelling NEUROLOGIC:  Alert and oriented x 3 PSYCHIATRIC:  Normal affect   ASSESSMENT:    1. Peripheral vascular disease, unspecified (HCC) up to 79% p both sides carotids   2. Mixed hyperlipidemia   3. Coronary artery disease involving native coronary artery of native heart without angina pectoris    PLAN:    In order of problems listed  above:  1. Coronary disease with distal disease involving diagonal circumflex and PDA.  I will ask him to start taking ranolazine 500 mg twice daily, I will try to avoid using beta-blocker because of issues with his lungs.  In the meantime we will continue antiplatelet therapy, he is taking aspirin which I will continue for now. 2. Dyslipidemia I did review his K PN from 02/24/2020 showing me  LDL of 91 HDL 51.  I asked him to double the dose of Lipitor from 40 mg to 80 mg. 3. 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  Plan is to continue aggressive risk modifications.  Therefore, we did talk about good diet we did talk about need to exercise on the regular basis which he is planning to do.   Medication Adjustments/Labs and Tests Ordered: Current medicines are reviewed at length with the patient today.  Concerns regarding medicines are outlined above.  No orders of the defined types were placed in this encounter.  Medication changes: No orders of the defined types were placed in this encounter.   Signed, Georgeanna Lea, MD, [facility] 03/24/2020 3:26 PM    [facility]

## 2020-03-24 NOTE — Patient Instructions (Signed)
Medication Instructions:  1) Start Ranolazine (Ranexa) 500 mg twice a day   2) Increase Atorvastatin to 80 mg daily   *If you need a refill on your cardiac medications before your next appointment, please call your pharmacy*   Lab Work: Your physician recommends that you return for a FASTING lipid profile, ALT and AST in 6 weeks (around December 30th)  If you have labs (blood work) drawn today and your tests are completely normal, you will receive your results only by: Marland Kitchen MyChart Message (if you have MyChart) OR . A paper copy in the mail If you have any lab test that is abnormal or we need to change your treatment, we will call you to review the results.   Testing/Procedures: None ordered    Follow-Up: At [facility], you and your health needs are our priority.  As part of our continuing mission to provide you with exceptional heart care, we have created designated Provider Care Teams.  These Care Teams include your primary Cardiologist (physician) and Advanced Practice Providers (APPs -  Physician Assistants and Nurse Practitioners) who all work together to provide you with the care you need, when you need it.  We recommend signing up for the patient portal called "MyChart".  Sign up information is provided on this After Visit Summary.  MyChart is used to connect with patients for Virtual Visits (Telemedicine).  Patients are able to view lab/test results, encounter notes, upcoming appointments, etc.  Non-urgent messages can be sent to your provider as well.   To learn more about what you can do with MyChart, go to [email or web address].    Your next appointment:   3 month(s)  The format for your next appointment:   In Person  Provider:   Gypsy Balsam, MD   Other Instructions None

## 2020-04-21 ENCOUNTER — Telehealth: Payer: Self-pay

## 2020-04-21 NOTE — Telephone Encounter (Signed)
CT results request faxed to [facility] per patient verbalized approval.

## 2020-05-05 ENCOUNTER — Telehealth: Payer: Self-pay

## 2020-05-05 LAB — LIPID PANEL
Chol/HDL Ratio: 3 ratio (ref 0.0–5.0)
Cholesterol, Total: 139 mg/dL (ref 100–199)
HDL: 47 mg/dL (ref >39)
LDL Chol Calc (NIH): 73 mg/dL (ref 0–99)
Triglycerides: 103 mg/dL (ref 0–149)
VLDL Cholesterol Cal: 19 mg/dL (ref 5–40)

## 2020-05-05 LAB — ALT: ALT: 20 IU/L (ref 0–44)

## 2020-05-05 LAB — AST: AST: 18 IU/L (ref 0–40)

## 2020-05-05 NOTE — Telephone Encounter (Signed)
Patient notified of results and verbalized understanding.  

## 2020-05-05 NOTE — Telephone Encounter (Signed)
-----   Message from Georgeanna Lea, MD sent at 05/05/2020  8:29 AM EST ----- Cholesterol looks fine, continue present management

## 2020-05-09 ENCOUNTER — Other Ambulatory Visit: Payer: Self-pay

## 2020-05-09 ENCOUNTER — Ambulatory Visit: Payer: Medicare PPO | Admitting: Cardiology

## 2020-05-09 ENCOUNTER — Encounter: Payer: Self-pay | Admitting: Cardiology

## 2020-05-09 VITALS — BP 142/78 | HR 73 | Ht 68.0 in | Wt 239.0 lb

## 2020-05-09 DIAGNOSIS — E782 Mixed hyperlipidemia: Secondary | ICD-10-CM | POA: Diagnosis not present

## 2020-05-09 DIAGNOSIS — R0609 Other forms of dyspnea: Secondary | ICD-10-CM

## 2020-05-09 DIAGNOSIS — I10 Essential (primary) hypertension: Secondary | ICD-10-CM

## 2020-05-09 DIAGNOSIS — I739 Peripheral vascular disease, unspecified: Secondary | ICD-10-CM

## 2020-05-09 DIAGNOSIS — I2510 Atherosclerotic heart disease of native coronary artery without angina pectoris: Secondary | ICD-10-CM | POA: Diagnosis not present

## 2020-05-09 DIAGNOSIS — R0600 Dyspnea, unspecified: Secondary | ICD-10-CM

## 2020-05-09 NOTE — Progress Notes (Signed)
Cardiology Office Note:    Date:  05/09/2020   ID:  Russell Butler, DOB Apr 21, 1946, MRN [id number]  PCP:  [place], Sarah T, MD  Cardiologist:  Gypsy Balsam, MD    Referring MD: [place], Sarah T, MD   Chief Complaint  Patient presents with  . Follow-up  I am doing fine  History of Present Illness:    Russell Butler is a 75 y.o. male with past medical history significant for coronary artery disease, he did have coronary CT angio done which showed some distal diagonal disease as well as circumflex disease and PDA.  I try to give him ranolazine 5 mg twice daily but he cannot tell me much difference.  Trying to avoid beta-blocker because of chronic lung condition.  He is on antiplatelet therapy which I will continue.  He comes today to my office for follow-up overall doing well.  Denies have any chest pain tightness squeezing pressure burning chest, however, he tells me he does not exercise much he loves to eat and he reads a lot but does not walk.  He is fine to go to [place] for winter like he always does and he hopes that he will be able to walk a little bit more there which I encouraged him to do.  Denies have any TIA/CVA-like symptoms.  He does have significant bilateral carotic arterial disease but asymptomatic.  Past Medical History:  Diagnosis Date  . Carotid bruit 12/28/2019  . Chest pain of uncertain etiology 12/28/2019  . Coronary artery disease 03/24/2020  . Dyspnea   . Essential hypertension   . Hyperlipidemia   . Hypertension   . Mixed hyperlipidemia   . Peripheral vascular disease, unspecified (HCC) up to 79% p both sides carotids 02/24/2020  . Snoring   . SOB (shortness of breath)   . Tinnitus     Past Surgical History:  Procedure Laterality Date  . NO PAST SURGERIES      Current Medications: Current Meds  Medication Sig  . albuterol (VENTOLIN HFA) 108 (90 Base) MCG/ACT inhaler   . aspirin EC 81 MG tablet Take 81 mg by mouth daily. Swallow whole.  Marland Kitchen atorvastatin  (LIPITOR) 80 MG tablet Take 1 tablet (80 mg total) by mouth daily.  Marland Kitchen BREO ELLIPTA 100-25 MCG/INH AEPB   . hydrochlorothiazide (HYDRODIURIL) 25 MG tablet Take 25 mg by mouth daily.  Marland Kitchen lisinopril (ZESTRIL) 40 MG tablet Take 40 mg by mouth daily.  . ranolazine (RANEXA) 500 MG 12 hr tablet Take 1 tablet (500 mg total) by mouth 2 (two) times daily.     Allergies:   Patient has no known allergies.   Social History   Socioeconomic History  . Marital status: Unknown    Spouse name: Not on file  . Number of children: Not on file  . Years of education: Not on file  . Highest education level: Not on file  Occupational History  . Not on file  Tobacco Use  . Smoking status: Former Smoker    Types: Cigarettes    Quit date: 12/28/1979    Years since quitting: 40.3  . Smokeless tobacco: Never Used  Substance and Sexual Activity  . Alcohol use: Yes    Alcohol/week: 4.0 standard drinks    Types: 4 Glasses of wine per week  . Drug use: Never  . Sexual activity: Not on file  Other Topics Concern  . Not on file  Social History Narrative  . Not on file   Social Determinants of Health  Financial Resource Strain: Not on file  Food Insecurity: Not on file  Transportation Needs: Not on file  Physical Activity: Not on file  Stress: Not on file  Social Connections: Not on file     Family History: The patient's family history includes Cancer in his paternal grandfather; Depression in his mother; Diabetes in his maternal grandfather; Hypercholesterolemia in his brother; Hypertension in his brother; Liver disease in his father. ROS:   Please see the history of present illness.    All 14 point review of systems negative except as described per history of present illness  EKGs/Labs/Other Studies Reviewed:      Recent Labs: 03/08/2020: BUN 18; Creatinine, Ser 1.36; Potassium 4.4; Sodium 138 05/04/2020: ALT 20  Recent Lipid Panel    Component Value Date/Time   CHOL 139 05/04/2020 1050    TRIG 103 05/04/2020 1050   HDL 47 05/04/2020 1050   CHOLHDL 3.0 05/04/2020 1050   LDLCALC 73 05/04/2020 1050    Physical Exam:    VS:  BP (!) 142/78 (BP Location: Right Arm, Patient Position: Sitting)   Pulse 73   Ht 5\' 8"  (1.727 m)   Wt 239 lb (108.4 kg)   SpO2 95%   BMI 36.34 kg/m     Wt Readings from Last 3 Encounters:  05/09/20 239 lb (108.4 kg)  03/24/20 242 lb (109.8 kg)  02/24/20 243 lb 6.4 oz (110.4 kg)     GEN:  Well nourished, well developed in no acute distress HEENT: Normal NECK: No JVD; No carotid bruits LYMPHATICS: No lymphadenopathy CARDIAC: RRR, no murmurs, no rubs, no gallops RESPIRATORY:  Clear to auscultation without rales, wheezing or rhonchi  ABDOMEN: Soft, non-tender, non-distended MUSCULOSKELETAL:  No edema; No deformity  SKIN: Warm and dry LOWER EXTREMITIES: no swelling NEUROLOGIC:  Alert and oriented x 3 PSYCHIATRIC:  Normal affect   ASSESSMENT:    1. Coronary artery disease involving native coronary artery of native heart without angina pectoris   2. Essential hypertension   3. Peripheral vascular disease, unspecified ([place]) up to 79% p both sides carotids   4. Mixed hyperlipidemia   5. Dyspnea on exertion    PLAN:    In order of problems listed above:  1. Coronary artery disease stable from that point review I gave him ranolazine he cannot tell me any difference except for the fact that he is constipated.  I told him to stop her malaise and see what he does if he still having chest pain tightness squeezing pressure burning chest or shortness of breath with exertion he need to go back on some antianginal therapy could be ranolazine if he can manage his constipation.  In the meantime we will continue antiplatelet therapy as well as statin. 2. Dyslipidemia: I did review his K PN which show me his LDL of 73 HDL 47 this is from just few days ago.  I will continue present management which include high intensity statin in form of Lipitor  80. 3. Essential hypertension his blood pressure is controlled continue present management. 4. 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 When I see him we will schedule him to have carotic ultrasound.   Medication Adjustments/Labs and Tests Ordered: Current medicines are reviewed at length with the patient today.  Concerns regarding medicines are outlined above.  No orders of the defined types were placed in this encounter.  Medication changes: No orders of the defined types were placed in this encounter.   Signed, Marily Lente.  Bing Matter, MD, [facility] 05/09/2020 1:32 PM    [facility]

## 2020-05-09 NOTE — Patient Instructions (Signed)

## 2020-06-03 DIAGNOSIS — E6609 Other obesity due to excess calories: Secondary | ICD-10-CM | POA: Insufficient documentation

## 2020-08-22 ENCOUNTER — Encounter: Payer: Self-pay | Admitting: Cardiology

## 2020-08-22 ENCOUNTER — Other Ambulatory Visit: Payer: Self-pay

## 2020-08-22 ENCOUNTER — Ambulatory Visit: Payer: Medicare PPO | Admitting: Cardiology

## 2020-08-22 VITALS — BP 108/64 | HR 80 | Ht 68.0 in | Wt 246.0 lb

## 2020-08-22 DIAGNOSIS — I739 Peripheral vascular disease, unspecified: Secondary | ICD-10-CM

## 2020-08-22 DIAGNOSIS — E782 Mixed hyperlipidemia: Secondary | ICD-10-CM

## 2020-08-22 DIAGNOSIS — I2510 Atherosclerotic heart disease of native coronary artery without angina pectoris: Secondary | ICD-10-CM

## 2020-08-22 DIAGNOSIS — I10 Essential (primary) hypertension: Secondary | ICD-10-CM

## 2020-08-22 NOTE — Addendum Note (Signed)
Addended by: Hazle Quant on: 08/22/2020 03:02 PM   Modules accepted: Orders

## 2020-08-22 NOTE — Patient Instructions (Signed)
Medication Instructions:  Your physician recommends that you continue on your current medications as directed. Please refer to the Current Medication list given to you today.  *If you need a refill on your cardiac medications before your next appointment, please call your pharmacy*   Lab Work: None If you have labs (blood work) drawn today and your tests are completely normal, you will receive your results only by: Marland Kitchen MyChart Message (if you have MyChart) OR . A paper copy in the mail If you have any lab test that is abnormal or we need to change your treatment, we will call you to review the results.   Testing/Procedures: Your physician has requested that you have a carotid duplex. This test is an ultrasound of the carotid arteries in your neck. It looks at blood flow through these arteries that supply the brain with blood. Allow one hour for this exam. There are no restrictions or special instructions.  Your physician has requested that you have a lower extremity arterial exercise duplex. During this test, exercise and ultrasound are used to evaluate arterial blood flow in the legs. Allow one hour for this exam. There are no restrictions or special instructions.   Follow-Up: At [facility], you and your health needs are our priority.  As part of our continuing mission to provide you with exceptional heart care, we have created designated Provider Care Teams.  These Care Teams include your primary Cardiologist (physician) and Advanced Practice Providers (APPs -  Physician Assistants and Nurse Practitioners) who all work together to provide you with the care you need, when you need it.  We recommend signing up for the patient portal called "MyChart".  Sign up information is provided on this After Visit Summary.  MyChart is used to connect with patients for Virtual Visits (Telemedicine).  Patients are able to view lab/test results, encounter notes, upcoming appointments, etc.  Non-urgent  messages can be sent to your provider as well.   To learn more about what you can do with MyChart, go to [email or web address].    Your next appointment:   6 month(s)  The format for your next appointment:   In Person  Provider:   Gypsy Balsam, MD   Other Instructions

## 2020-08-22 NOTE — Progress Notes (Signed)
Cardiology Office Note:    Date:  08/22/2020   ID:  Gid Schoffstall, DOB [birth year], MRN [id number]  PCP:  [place], Sarah T, MD  Cardiologist:  Gypsy Balsam, MD    Referring MD: [place], Sarah T, MD   Chief Complaint  Patient presents with  . Follow-up  Doing fine  History of Present Illness:    Russell Butler is a 75 y.o. 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  Comes today 2 months of follow-up overall doing well.  Denies have any chest pain tightness squeezing pressure burning chest, he just came back from [place] where he normally spends his winter.  He promised me to be able to walk more while they are however he said he did not do it because of hip pain interestingly where he described hip pain is exertional and it does not look like arthritic pain.  There is no chest pain tightness squeezing pressure in chest.  Past Medical History:  Diagnosis Date  . Carotid bruit 12/28/2019  . Chest pain of uncertain etiology 12/28/2019  . Coronary artery disease 03/24/2020  . Dyspnea   . Essential hypertension   . Hyperlipidemia   . Hypertension   . Mixed hyperlipidemia   . Peripheral vascular disease, unspecified (HCC) up to 79% p both sides carotids 02/24/2020  . Snoring   . SOB (shortness of breath)   . Tinnitus     Past Surgical History:  Procedure Laterality Date  . NO PAST SURGERIES      Current Medications: Current Meds  Medication Sig  . albuterol (VENTOLIN HFA) 108 (90 Base) MCG/ACT inhaler Inhale 2 puffs into the lungs every 4 (four) hours as needed for wheezing or shortness of breath.  Marland Kitchen aspirin EC 81 MG tablet Take 81 mg by mouth daily. Swallow whole.  Marland Kitchen atorvastatin (LIPITOR) 80 MG tablet Take 1  tablet (80 mg total) by mouth daily.  Marland Kitchen BREO ELLIPTA 100-25 MCG/INH AEPB Inhale 2 puffs into the lungs daily.  . hydrochlorothiazide (HYDRODIURIL) 25 MG tablet Take 25 mg by mouth daily.  Marland Kitchen lisinopril (ZESTRIL) 40 MG tablet Take 40 mg by mouth daily.     Allergies:   Patient has no known allergies.   Social History   Socioeconomic History  . Marital status: Unknown    Spouse name: Not on file  . Number of children: Not on file  . Years of education: Not on file  . Highest education level: Not on file  Occupational History  . Not on file  Tobacco Use  . Smoking status: Former Smoker    Types: Cigarettes    Quit date: 12/28/1979    Years since quitting: 40.6  . Smokeless tobacco: Never Used  Substance and Sexual Activity  . Alcohol use: Yes    Alcohol/week: 4.0 standard drinks    Types: 4 Glasses of wine per week  . Drug use: Never  . Sexual activity: Not on file  Other Topics Concern  . Not on file  Social History Narrative  . Not on file   Social Determinants of Health   Financial Resource Strain: Not on file  Food Insecurity: Not on file  Transportation Needs: Not on file  Physical Activity: Not on file  Stress: Not on file  Social Connections: Not on  file     Family History: The patient's family history includes Cancer in his paternal grandfather; Depression in his mother; Diabetes in his maternal grandfather; Hypercholesterolemia in his brother; Hypertension in his brother; Liver disease in his father. ROS:   Please see the history of present illness.    All 14 point review of systems negative except as described per history of present illness  EKGs/Labs/Other Studies Reviewed:      Recent Labs: 03/08/2020: BUN 18; Creatinine, Ser 1.36; Potassium 4.4; Sodium 138 05/04/2020: ALT 20  Recent Lipid Panel    Component Value Date/Time   CHOL 139 05/04/2020 1050   TRIG 103 05/04/2020 1050   HDL 47 05/04/2020 1050   CHOLHDL 3.0 05/04/2020 1050   LDLCALC 73  05/04/2020 1050    Physical Exam:    VS:  BP 108/64 (BP Location: Left Arm, Patient Position: Sitting)   Pulse 80   Ht 5\' 8"  (1.727 m)   Wt 246 lb (111.6 kg)   SpO2 98%   BMI 37.40 kg/m     Wt Readings from Last 3 Encounters:  08/22/20 246 lb (111.6 kg)  05/09/20 239 lb (108.4 kg)  03/24/20 242 lb (109.8 kg)     GEN:  Well nourished, well developed in no acute distress HEENT: Normal NECK: No JVD; No carotid bruits LYMPHATICS: No lymphadenopathy CARDIAC: RRR, no murmurs, no rubs, no gallops RESPIRATORY:  Clear to auscultation without rales, wheezing or rhonchi  ABDOMEN: Soft, non-tender, non-distended MUSCULOSKELETAL:  No edema; No deformity  SKIN: Warm and dry LOWER EXTREMITIES: no swelling, dorsalis pedis absent, posterior tibial very weak NEUROLOGIC:  Alert and oriented x 3 PSYCHIATRIC:  Normal affect   ASSESSMENT:    1. Coronary artery disease involving native coronary artery of native heart without angina pectoris   2. Peripheral vascular disease, unspecified (HCC) up to 79% p both sides carotids   3. Essential hypertension   4. Mixed hyperlipidemia    PLAN:    In order of problems listed above:  1. Coronary artery disease as proven by coronary CT angio distal disease he is asymptomatic heart level at the same time he is living fairly sedentary lifestyle.  I encouraged him to be a little more active. 2. Peripheral Vascular disease with carotid ultrasound done in September of last year showing up to 80% narrowing bilaterally.  We will repeat the test. 3. Essential hypertension, blood pressure well controlled continue present management. 4. Dyslipidemia, is LDL 73 HDL 47 this is from December 29.  And this is on Lipitor 80 which I will continue. 5. Symptoms of pain in his hip that are more exertional, concern is of course for possible problem.  We will schedule him to have arterial duplex of lower extremities    Medication Adjustments/Labs and Tests  Ordered: Current medicines are reviewed at length with the patient today.  Concerns regarding medicines are outlined above.  No orders of the defined types were placed in this encounter.  Medication changes: No orders of the defined types were placed in this encounter.   Signed, December 31, MD, [facility] 08/22/2020 2:53 PM    [facility]

## 2020-09-22 ENCOUNTER — Other Ambulatory Visit: Payer: Self-pay

## 2020-09-22 ENCOUNTER — Ambulatory Visit (INDEPENDENT_AMBULATORY_CARE_PROVIDER_SITE_OTHER): Payer: Medicare PPO

## 2020-09-22 DIAGNOSIS — E782 Mixed hyperlipidemia: Secondary | ICD-10-CM

## 2020-09-22 DIAGNOSIS — I739 Peripheral vascular disease, unspecified: Secondary | ICD-10-CM

## 2020-09-22 DIAGNOSIS — I10 Essential (primary) hypertension: Secondary | ICD-10-CM

## 2020-09-22 DIAGNOSIS — R0989 Other specified symptoms and signs involving the circulatory and respiratory systems: Secondary | ICD-10-CM

## 2020-09-22 DIAGNOSIS — I6523 Occlusion and stenosis of bilateral carotid arteries: Secondary | ICD-10-CM

## 2020-09-22 DIAGNOSIS — I2510 Atherosclerotic heart disease of native coronary artery without angina pectoris: Secondary | ICD-10-CM

## 2020-09-22 NOTE — Progress Notes (Signed)
Carotid duplex exam has been performed.  Jimmy Roshanda Balazs RDCS, RVT 

## 2020-09-22 NOTE — Progress Notes (Deleted)
Carotid duplex exam has been performed.  Jimmy Dashel Goines RDCS, RVT 

## 2020-09-22 NOTE — Progress Notes (Signed)
Bilateral lower extremity arterial duplex exam performed.  Jimmy Arrayah Connors RDCS, RVT 

## 2020-09-30 ENCOUNTER — Encounter: Payer: Self-pay | Admitting: Emergency Medicine

## 2020-10-31 ENCOUNTER — Telehealth: Payer: Self-pay | Admitting: Cardiology

## 2020-10-31 DIAGNOSIS — R9389 Abnormal findings on diagnostic imaging of other specified body structures: Secondary | ICD-10-CM

## 2020-10-31 NOTE — Telephone Encounter (Signed)
Called patient back informed him of results of carotid ultrasound and lower extremity US, placed referral for vascular surgery. No further questions.

## 2020-10-31 NOTE — Telephone Encounter (Signed)
Patient calling for vascular test results. ?

## 2020-11-14 ENCOUNTER — Encounter (INDEPENDENT_AMBULATORY_CARE_PROVIDER_SITE_OTHER): Payer: Medicare PPO

## 2020-11-14 ENCOUNTER — Ambulatory Visit (INDEPENDENT_AMBULATORY_CARE_PROVIDER_SITE_OTHER): Payer: Medicare PPO | Admitting: Vascular Surgery

## 2020-11-14 ENCOUNTER — Other Ambulatory Visit: Payer: Self-pay

## 2020-11-14 ENCOUNTER — Encounter (INDEPENDENT_AMBULATORY_CARE_PROVIDER_SITE_OTHER): Payer: Self-pay | Admitting: Vascular Surgery

## 2020-11-14 VITALS — BP 116/62 | HR 87 | Resp 16 | Ht 68.0 in | Wt 244.2 lb

## 2020-11-14 DIAGNOSIS — I2510 Atherosclerotic heart disease of native coronary artery without angina pectoris: Secondary | ICD-10-CM | POA: Diagnosis not present

## 2020-11-14 DIAGNOSIS — I10 Essential (primary) hypertension: Secondary | ICD-10-CM

## 2020-11-14 DIAGNOSIS — E782 Mixed hyperlipidemia: Secondary | ICD-10-CM | POA: Diagnosis not present

## 2020-11-14 DIAGNOSIS — I6529 Occlusion and stenosis of unspecified carotid artery: Secondary | ICD-10-CM | POA: Insufficient documentation

## 2020-11-14 DIAGNOSIS — I6523 Occlusion and stenosis of bilateral carotid arteries: Secondary | ICD-10-CM | POA: Diagnosis not present

## 2020-11-14 NOTE — Progress Notes (Signed)
Ct angio   MRN : [id number]  Russell Butler is a 75 y.o. (1945/07/23) male who presents with chief complaint of No chief complaint on file. Marland Kitchen  History of Present Illness:   The patient is seen for evaluation of carotid stenosis. The carotid stenosis was identified after he experienced visual changes.  He states "my vision was not blurred, it was like splotches"  The patient has had episodes of amaurosis fugax. There is no recent history of TIA symptoms or focal motor deficits. There is no prior documented CVA.  There is no history of migraine headaches. There is no history of seizures.  The patient is taking enteric-coated aspirin 81 mg daily.  The patient has a history of coronary artery disease, no recent episodes of angina or shortness of breath. The patient denies PAD or claudication symptoms. There is a history of hyperlipidemia which is being treated with a statin.    No outpatient medications have been marked as taking for the 11/14/20 encounter (Appointment) with Gilda Crease, Latina Craver, MD.    Past Medical History:  Diagnosis Date   Carotid bruit 12/28/2019   Chest pain of uncertain etiology 12/28/2019   Coronary artery disease 03/24/2020   Dyspnea    Essential hypertension    Hyperlipidemia    Hypertension    Mixed hyperlipidemia    Peripheral vascular disease, unspecified (HCC) up to 79% p both sides carotids 02/24/2020   Snoring    SOB (shortness of breath)    Tinnitus     Past Surgical History:  Procedure Laterality Date   NO PAST SURGERIES      Social History Social History   Tobacco Use   Smoking status: Former    Pack years: 0.00    Types: Cigarettes    Quit date: 12/28/1979    Years since quitting: 40.9   Smokeless tobacco: Never  Substance Use Topics   Alcohol use: Yes    Alcohol/week: 4.0 standard drinks    Types: 4 Glasses of wine per week   Drug use: Never    Family History Family History  Problem Relation Age of Onset   Depression Mother     Liver disease Father    Hypertension Brother    Hypercholesterolemia Brother    Diabetes Maternal Grandfather    Cancer Paternal Grandfather   No family history of bleeding/clotting disorders, porphyria or autoimmune disease   No Known Allergies   REVIEW OF SYSTEMS (Negative unless checked)  Constitutional: [] Weight loss  [] Fever  [] Chills Cardiac: [] Chest pain   [] Chest pressure   [] Palpitations   [] Shortness of breath when laying flat   [] Shortness of breath with exertion. Vascular:  [] Pain in legs with walking   [] Pain in legs at rest  [] History of DVT   [] Phlebitis   [] Swelling in legs   [] Varicose veins   [] Non-healing ulcers Pulmonary:   [] Uses home oxygen   [] Productive cough   [] Hemoptysis   [] Wheeze  [] COPD   [] Asthma Neurologic:  [] Dizziness   [] Seizures   [] History of stroke   [] History of TIA  [] Aphasia   [] Vissual changes   [] Weakness or numbness in arm   [] Weakness or numbness in leg Musculoskeletal:   [] Joint swelling   [] Joint pain   [] Low back pain Hematologic:  [] Easy bruising  [] Easy bleeding   [] Hypercoagulable state   [] Anemic Gastrointestinal:  [] Diarrhea   [] Vomiting  [] Gastroesophageal reflux/heartburn   [] Difficulty swallowing. Genitourinary:  [] Chronic kidney disease   [] Difficult urination  [] Frequent urination   []   Blood in urine Skin:  [] Rashes   [] Ulcers  Psychological:  [] History of anxiety   []  History of major depression.  Physical Examination  There were no vitals filed for this visit. There is no height or weight on file to calculate BMI. Gen: WD/WN, NAD Head: [place]/AT, No temporalis wasting.  Ear/Nose/Throat: Hearing grossly intact, nares w/o erythema or drainage, poor dentition Eyes: PER, EOMI, sclera nonicteric.  Neck: Supple, no masses.  No bruit or JVD.  Pulmonary:  Good air movement, clear to auscultation bilaterally, no use of accessory muscles.  Cardiac: RRR, normal S1, S2, no Murmurs. Vascular:  bilateral carotid bruits Vessel Right Left   Radial Palpable Palpable  Brachial Palpable Palpable  Carotid Palpable Palpable  Gastrointestinal: soft, non-distended. No guarding/no peritoneal signs.  Musculoskeletal: M/S 5/5 throughout.  No deformity or atrophy.  Neurologic: CN 2-12 intact. Pain and light touch intact in extremities.  Symmetrical.  Speech is fluent. Motor exam as listed above. Psychiatric: Judgment intact, Mood & affect appropriate for pt's clinical situation. Dermatologic: No rashes or ulcers noted.  No changes consistent with cellulitis. Lymph : No Cervical lymphadenopathy, no lichenification or skin changes of chronic lymphedema.  CBC No results found for: WBC, HGB, HCT, MCV, PLT  BMET    Component Value Date/Time   NA 138 03/08/2020 1338   K 4.4 03/08/2020 1338   CL 101 03/08/2020 1338   CO2 25 03/08/2020 1338   GLUCOSE 102 (H) 03/08/2020 1338   BUN 18 03/08/2020 1338   CREATININE 1.36 (H) 03/08/2020 1338   CALCIUM 9.8 03/08/2020 1338   GFRNONAA 51 (L) 03/08/2020 1338   GFRAA 59 (L) 03/08/2020 1338   CrCl cannot be calculated (Patient's most recent lab result is older than the maximum 21 days allowed.).  COAG No results found for: INR, PROTIME  Radiology No results found.   Assessment/Plan 1. Bilateral carotid artery stenosis The patient remains asymptomatic with respect to the carotid stenosis.  However, the patient has now progressed and has a lesion the is >70%.  Patient should undergo CT angiography of the carotid arteries to define the degree of stenosis of the internal carotid arteries bilaterally and the anatomic suitability for surgery vs. intervention.  If the patient does indeed need surgery cardiac clearance will be required, once cleared the patient will be scheduled for surgery.  The risks, benefits and alternative therapies were reviewed in detail with the patient.  All questions were answered.  The patient agrees to proceed with imaging.  Continue antiplatelet therapy as  prescribed. Continue management of CAD, HTN and Hyperlipidemia. Healthy heart diet, encouraged exercise at least 4 times per week.    2. Coronary artery disease involving native coronary artery of native heart without angina pectoris Continue cardiac and antihypertensive medications as already ordered and reviewed, no changes at this time.  Continue statin as ordered and reviewed, no changes at this time  Nitrates PRN for chest pain   3. Essential hypertension Continue antihypertensive medications as already ordered, these medications have been reviewed and there are no changes at this time.   4. Mixed hyperlipidemia Continue statin as ordered and reviewed, no changes at this time     13/06/2019, MD  11/14/2020 12:29 PM

## 2020-11-16 ENCOUNTER — Encounter (INDEPENDENT_AMBULATORY_CARE_PROVIDER_SITE_OTHER): Payer: Self-pay | Admitting: Vascular Surgery

## 2020-11-18 ENCOUNTER — Telehealth (INDEPENDENT_AMBULATORY_CARE_PROVIDER_SITE_OTHER): Payer: Self-pay

## 2020-11-18 NOTE — Telephone Encounter (Signed)
I returned a call to the patient, he left a message asking about his CT that was to be scheduled. I called back and gave him the number to radiology to get his appt scheduled as the order had been sent over earlier by Dr. Gilda Crease. I also reminded the patient to contact us after making the appt to schedule his follow up with Dr. Gilda Crease to discuss results.

## 2020-11-30 ENCOUNTER — Ambulatory Visit (HOSPITAL_COMMUNITY)
Admission: RE | Admit: 2020-11-30 | Discharge: 2020-11-30 | Disposition: A | Discharge status: Home / Self Care | Payer: Medicare PPO | Source: Ambulatory Visit | Attending: Vascular Surgery | Admitting: Vascular Surgery

## 2020-11-30 ENCOUNTER — Other Ambulatory Visit: Payer: Self-pay

## 2020-11-30 DIAGNOSIS — I6523 Occlusion and stenosis of bilateral carotid arteries: Secondary | ICD-10-CM

## 2020-11-30 LAB — POCT I-STAT CREATININE: Creatinine, Ser: 1.1 mg/dL (ref 0.61–1.24)

## 2020-11-30 MED ORDER — SODIUM CHLORIDE (PF) 0.9 % IJ SOLN
INTRAMUSCULAR | Status: AC
  Filled 2020-11-30: qty 50

## 2020-11-30 MED ORDER — IOHEXOL 350 MG/ML SOLN
75.0000 mL | Freq: Once | INTRAVENOUS | Status: AC | PRN
  Administered 2020-11-30: 75 mL via INTRAVENOUS

## 2020-12-05 DIAGNOSIS — I6523 Occlusion and stenosis of bilateral carotid arteries: Secondary | ICD-10-CM | POA: Insufficient documentation

## 2020-12-08 ENCOUNTER — Encounter (INDEPENDENT_AMBULATORY_CARE_PROVIDER_SITE_OTHER): Payer: Self-pay | Admitting: Vascular Surgery

## 2020-12-08 ENCOUNTER — Other Ambulatory Visit: Payer: Self-pay

## 2020-12-08 ENCOUNTER — Ambulatory Visit (INDEPENDENT_AMBULATORY_CARE_PROVIDER_SITE_OTHER): Payer: Medicare PPO | Admitting: Vascular Surgery

## 2020-12-08 VITALS — BP 120/66 | HR 76 | Resp 16 | Wt 244.6 lb

## 2020-12-08 DIAGNOSIS — E782 Mixed hyperlipidemia: Secondary | ICD-10-CM | POA: Diagnosis not present

## 2020-12-08 DIAGNOSIS — I6523 Occlusion and stenosis of bilateral carotid arteries: Secondary | ICD-10-CM

## 2020-12-08 DIAGNOSIS — I2510 Atherosclerotic heart disease of native coronary artery without angina pectoris: Secondary | ICD-10-CM | POA: Diagnosis not present

## 2020-12-08 DIAGNOSIS — I10 Essential (primary) hypertension: Secondary | ICD-10-CM | POA: Diagnosis not present

## 2020-12-08 NOTE — Progress Notes (Signed)
MRN : [id number]  Russell Butler is a 75 y.o. (07-30-1945) male who presents with chief complaint of No chief complaint on file. Marland Kitchen  History of Present Illness:   The patient is seen for follow up evaluation of carotid stenosis status post CT angiogram. CT scan was done 11/30/2020. Patient reports that the test went well with no problems or complications.   The patient denies interval amaurosis fugax. There is no recent or interval TIA symptoms or focal motor deficits. There is no prior documented CVA.  The patient is taking enteric-coated aspirin 81 mg daily.  There is no history of migraine headaches. There is no history of seizures.  The patient has a history of coronary artery disease, no recent episodes of angina or shortness of breath. The patient denies PAD or claudication symptoms. There is a history of hyperlipidemia which is being treated with a statin.    CT angiogram is reviewed by me personally and shows 80% stenosis consistent with calcified plaque at the origin of the right internal carotid artery.    No outpatient medications have been marked as taking for the 12/08/20 encounter (Appointment) with Gilda Crease, Latina Craver, MD.    Past Medical History:  Diagnosis Date   Carotid bruit 12/28/2019   Chest pain of uncertain etiology 12/28/2019   Coronary artery disease 03/24/2020   Dyspnea    Essential hypertension    Hyperlipidemia    Hypertension    Mixed hyperlipidemia    Peripheral vascular disease, unspecified (HCC) up to 79% p both sides carotids 02/24/2020   Snoring    SOB (shortness of breath)    Tinnitus     Past Surgical History:  Procedure Laterality Date   NO PAST SURGERIES      Social History Social History   Tobacco Use   Smoking status: Former    Types: Cigarettes    Quit date: 12/28/1979    Years since quitting: 40.9   Smokeless tobacco: Never  Substance Use Topics   Alcohol use: Yes    Alcohol/week: 4.0 standard drinks    Types: 4 Glasses of  wine per week   Drug use: Never    Family History Family History  Problem Relation Age of Onset   Depression Mother    Liver disease Father    Hypertension Brother    Hypercholesterolemia Brother    Diabetes Maternal Grandfather    Cancer Paternal Grandfather     No Known Allergies   REVIEW OF SYSTEMS (Negative unless checked)  Constitutional: [] Weight loss  [] Fever  [] Chills Cardiac: [] Chest pain   [] Chest pressure   [] Palpitations   [] Shortness of breath when laying flat   [] Shortness of breath with exertion. Vascular:  [] Pain in legs with walking   [] Pain in legs at rest  [] History of DVT   [] Phlebitis   [] Swelling in legs   [] Varicose veins   [] Non-healing ulcers Pulmonary:   [] Uses home oxygen   [] Productive cough   [] Hemoptysis   [] Wheeze  [] COPD   [] Asthma Neurologic:  [] Dizziness   [] Seizures   [] History of stroke   [] History of TIA  [] Aphasia   [] Vissual changes   [] Weakness or numbness in arm   [] Weakness or numbness in leg Musculoskeletal:   [] Joint swelling   [] Joint pain   [] Low back pain Hematologic:  [] Easy bruising  [] Easy bleeding   [] Hypercoagulable state   [] Anemic Gastrointestinal:  [] Diarrhea   [] Vomiting  [] Gastroesophageal reflux/heartburn   [] Difficulty swallowing. Genitourinary:  [] Chronic kidney disease   []   Difficult urination  [] Frequent urination   [] Blood in urine Skin:  [] Rashes   [] Ulcers  Psychological:  [] History of anxiety   []  History of major depression.  Physical Examination  There were no vitals filed for this visit. There is no height or weight on file to calculate BMI. Gen: WD/WN, NAD Head: [place]/AT, No temporalis wasting.  Ear/Nose/Throat: Hearing grossly intact, nares w/o erythema or drainage Eyes: PER, EOMI, sclera nonicteric.  Neck: Supple, no large masses.   Pulmonary:  Good air movement, no audible wheezing bilaterally, no use of accessory muscles.  Cardiac: RRR, no JVD Vascular:  bilateral carotid bruits Vessel Right Left   Radial Palpable Palpable  Brachial Palpable Palpable  Carotid Palpable Palpable  Gastrointestinal: Non-distended. No guarding/no peritoneal signs.  Musculoskeletal: M/S 5/5 throughout.  No deformity or atrophy.  Neurologic: CN 2-12 intact. Symmetrical.  Speech is fluent. Motor exam as listed above. Psychiatric: Judgment intact, Mood & affect appropriate for pt's clinical situation. Dermatologic: No rashes or ulcers noted.  No changes consistent with cellulitis.   CBC No results found for: WBC, HGB, HCT, MCV, PLT  BMET    Component Value Date/Time   NA 138 03/08/2020 1338   K 4.4 03/08/2020 1338   CL 101 03/08/2020 1338   CO2 25 03/08/2020 1338   GLUCOSE 102 (H) 03/08/2020 1338   BUN 18 03/08/2020 1338   CREATININE 1.10 11/30/2020 1321   CALCIUM 9.8 03/08/2020 1338   GFRNONAA 51 (L) 03/08/2020 1338   GFRAA 59 (L) 03/08/2020 1338   CrCl cannot be calculated (Unknown ideal weight.).  COAG No results found for: INR, PROTIME  Radiology CT ANGIO NECK W OR WO CONTRAST  Result Date: 12/01/2020 CLINICAL DATA:  Bilateral carotid artery stenosis; stroke/TIA assess extracranial arteries EXAM: CT ANGIOGRAPHY NECK TECHNIQUE: Multidetector CT imaging of the neck was performed using the standard protocol during bolus administration of intravenous contrast. Multiplanar CT image reconstructions and MIPs were obtained to evaluate the vascular anatomy. Carotid stenosis measurements (when applicable) are obtained utilizing NASCET criteria, using the distal internal carotid diameter as the denominator. CONTRAST:  45mL OMNIPAQUE IOHEXOL 350 MG/ML SOLN COMPARISON:  None. FINDINGS: Streak artifact is present from dental amalgam and venous contrast. Aortic arch: Calcified plaque along the arch and patent great vessel origins. No high-grade proximal subclavian artery stenosis. Right carotid system: Patent. Mild calcified plaque along the common carotid. There is heavy calcified plaque at the bifurcation  and along the proximal internal carotid. Focal noncalcified plaque is also present along the proximal internal carotid. Stenosis measures up to 82%. Left carotid system: Patent. Mild mixed plaque along the common carotid with less than 50% stenosis. Calcified plaque at the bifurcation and along the proximal internal carotid resulting in up to 60% stenosis. Vertebral arteries: Patent. Right vertebral artery is dominant. Mild calcified plaque along the right vertebral artery. There is plaque adjacent to the origins without apparent high-grade stenosis, noting streak artifact to this region. Skeleton: Multilevel degenerative changes of the cervical spine. Other neck: Complete opacification of the smaller right maxillary sinus with mild bowing of the floor of the orbit; may reflect status atelectasis. Upper chest: Included upper lungs are clear. IMPRESSION: Bilateral common and internal carotid artery atherosclerosis. There is up to 82% stenosis of the proximal right ICA and 60% stenosis of the proximal left ICA. Electronically Signed   By: 12/02/2020 M.D.   On: 12/01/2020 09:37     Assessment/Plan 1. Bilateral carotid artery stenosis Recommend:  The patient remains asymptomatic  with respect to the carotid stenosis.  However, the patient has now progressed and has a lesion the is >75%.  Patient's CT angiography of the carotid arteries confirms >80% right ICA stenosis.  The anatomical considerations support surgery over stenting.  This was discussed in detail with the patient.  The patient does indeed need surgery, therefore, cardiac clearance will be arranged. Once cleared the patient will be scheduled for surgery.  The risks, benefits and alternative therapies were reviewed in detail with the patient.  All questions were answered.  The patient agrees to proceed with surgery of the right carotid artery.  Continue antiplatelet therapy as prescribed. Continue management of CAD, HTN and  Hyperlipidemia. Healthy heart diet, encouraged exercise at least 4 times per week.    2. Coronary artery disease involving native coronary artery of native heart without angina pectoris Continue cardiac and antihypertensive medications as already ordered and reviewed, no changes at this time.  Continue statin as ordered and reviewed, no changes at this time  Nitrates PRN for chest pain   3. Essential hypertension Continue antihypertensive medications as already ordered, these medications have been reviewed and there are no changes at this time.   4. Mixed hyperlipidemia Continue statin as ordered and reviewed, no changes at this time     Levora Dredge, MD  12/08/2020 1:30 PM

## 2020-12-10 ENCOUNTER — Encounter (INDEPENDENT_AMBULATORY_CARE_PROVIDER_SITE_OTHER): Payer: Self-pay | Admitting: Vascular Surgery

## 2020-12-26 ENCOUNTER — Telehealth (INDEPENDENT_AMBULATORY_CARE_PROVIDER_SITE_OTHER): Payer: Self-pay | Admitting: Vascular Surgery

## 2020-12-26 NOTE — Telephone Encounter (Signed)
Called stating that he believes there was a mix up with his providers. Patient states that his Cardiologist has a similar name to Cardiologist here in [place]. Patients' cardiologist is located at [facility] in [place] and his name is Dr. Bing Matter. Phone number to facility is [phone].

## 2020-12-28 ENCOUNTER — Telehealth: Payer: Self-pay

## 2020-12-28 NOTE — Telephone Encounter (Signed)
   [facility] Pre-operative Risk Assessment    Patient Name: Ulric Salzman  DOB: 02-15-1946 MRN: [id number]  HEARTCARE STAFF:  - IMPORTANT!!!!!! Under Visit Info/Reason for Call, type in Other and utilize the format Clearance MM/DD/YY or Clearance TBD. Do not use dashes or single digits. - Please review there is not already an duplicate clearance open for this procedure. - If request is for dental extraction, please clarify the # of teeth to be extracted. - If the patient is currently at the dentist's office, call Pre-Op Callback Staff (MA/nurse) to input urgent request.  - If the patient is not currently in the dentist office, please route to the Pre-Op pool.  Request for surgical clearance:  What type of surgery is being performed? Right Carotid Endarterectomy  When is this surgery scheduled? TBD  What type of clearance is required (medical clearance vs. Pharmacy clearance to hold med vs. Both)? Cardiac  Are there any medications that need to be held prior to surgery and how long? Aspirin  Practice name and name of physician performing surgery? [facility], Hortencia Pilar, MD  What is the office phone number? [phone]   7.   What is the office fax number? [phone]  8.   Anesthesia type (None, local, MAC, general) ? N/A   Orvan July 12/28/2020, 3:15 PM  _________________________________________________________________   (provider comments below)

## 2020-12-28 NOTE — Telephone Encounter (Signed)
   Name: Russell Butler  DOB: [birth year]  MRN: [id number]  Primary Cardiologist: None  Chart reviewed as part of pre-operative protocol coverage. Because of Russell Butler past medical history and time since last visit, he will require a follow-up visit in order to better assess preoperative cardiovascular risk.  Pre-op covering staff: - Please add "pre-op clearance" to the appointment on 01/02/21 with Dr. Bing Matter so provider is aware.  If applicable, this message will also be routed to pharmacy pool and/or primary cardiologist for input on holding anticoagulant/antiplatelet agent as requested below so that this information is available to the clearing provider at time of patient's appointment.   Georgie Chard, NP  12/28/2020, 3:23 PM

## 2021-01-02 ENCOUNTER — Other Ambulatory Visit: Payer: Self-pay

## 2021-01-02 ENCOUNTER — Ambulatory Visit: Payer: Medicare PPO | Admitting: Cardiology

## 2021-01-02 ENCOUNTER — Encounter: Payer: Self-pay | Admitting: Cardiology

## 2021-01-02 VITALS — BP 102/64 | HR 74 | Ht 68.0 in | Wt 245.8 lb

## 2021-01-02 DIAGNOSIS — I739 Peripheral vascular disease, unspecified: Secondary | ICD-10-CM | POA: Diagnosis not present

## 2021-01-02 DIAGNOSIS — I2510 Atherosclerotic heart disease of native coronary artery without angina pectoris: Secondary | ICD-10-CM

## 2021-01-02 DIAGNOSIS — Z01818 Encounter for other preprocedural examination: Secondary | ICD-10-CM | POA: Diagnosis not present

## 2021-01-02 DIAGNOSIS — I10 Essential (primary) hypertension: Secondary | ICD-10-CM

## 2021-01-02 DIAGNOSIS — E782 Mixed hyperlipidemia: Secondary | ICD-10-CM

## 2021-01-02 DIAGNOSIS — Z01810 Encounter for preprocedural cardiovascular examination: Secondary | ICD-10-CM

## 2021-01-02 NOTE — H&P (View-Only) (Signed)
Cardiology Office Note:    Date:  01/02/2021   ID:  [name], DOB 12/09/1945, MRN [id number]  PCP:  [place], Sarah T, MD  Cardiologist:  Gypsy Balsam, MD    Referring MD: [place], Sarah T, MD   Chief Complaint  Patient presents with   Clearance TBD    Dr. Lorretta Harp    History of Present Illness:    Russell Butler is a 75 y.o. male with past medical history significant for coronary artery disease, he did have coronary CT angio which showed distal disease involving diagonal branches circumflex artery as well as PDA.  He was also identified to have significant bilateral carotic arterial disease surgery is contemplated.  I understand the preference is to do open carotic endarterectomy.  He came to my office today to talk about options.  He does have also history of hypertension, dyslipidemia.  He tells me he gets short of breath easily.  He tells me also that he can walk but I really cannot get enough information from him to see if he is able to do 4 METS.  He said he walks very slowly he tells me that he cannot go upstairs because he simply does not have stairs at home.  When asked him if he can keep up with me walking he tells me absolutely not because he cannot walk fast I am worried that he does not walk well enough to tell me that he is safe to have the surgery done.  I did review coronary CT angio with him which showed distal disease.  I think there is some value of cardiac catheterization.  Cardiac catheterization can precisely tell us location of the problem.  I did explain to him cardiac catheterization procedure including all risk benefits as well as alternatives.  He is willing to proceed.  Past Medical History:  Diagnosis Date   Carotid bruit 12/28/2019   Chest pain of uncertain etiology 12/28/2019   Coronary artery disease 03/24/2020   Dyspnea    Essential hypertension    Hyperlipidemia    Hypertension    Mixed hyperlipidemia    Peripheral vascular disease, unspecified  (HCC) up to 79% p both sides carotids 02/24/2020   Snoring    SOB (shortness of breath)    Tinnitus     Past Surgical History:  Procedure Laterality Date   NO PAST SURGERIES      Current Medications: Current Meds  Medication Sig   aspirin EC 81 MG tablet Take 81 mg by mouth daily. Swallow whole.   atorvastatin (LIPITOR) 80 MG tablet Take 1 tablet (80 mg total) by mouth daily.   BREO ELLIPTA 100-25 MCG/INH AEPB Inhale 2 puffs into the lungs daily.   hydrochlorothiazide (HYDRODIURIL) 25 MG tablet Take 25 mg by mouth daily.   lisinopril (ZESTRIL) 40 MG tablet Take 40 mg by mouth daily.   [DISCONTINUED] albuterol (VENTOLIN HFA) 108 (90 Base) MCG/ACT inhaler Inhale 2 puffs into the lungs every 4 (four) hours as needed for wheezing or shortness of breath.     Allergies:   Patient has no known allergies.   Social History   Socioeconomic History   Marital status: Unknown    Spouse name: Not on file   Number of children: Not on file   Years of education: Not on file   Highest education level: Not on file  Occupational History   Not on file  Tobacco Use   Smoking status: Former    Types: Cigarettes    Quit  date: 12/28/1979    Years since quitting: 41.0   Smokeless tobacco: Never  Substance and Sexual Activity   Alcohol use: Yes    Alcohol/week: 4.0 standard drinks    Types: 4 Glasses of wine per week   Drug use: Never   Sexual activity: Not on file  Other Topics Concern   Not on file  Social History Narrative   Not on file   Social Determinants of Health   Financial Resource Strain: Not on file  Food Insecurity: Not on file  Transportation Needs: Not on file  Physical Activity: Not on file  Stress: Not on file  Social Connections: Not on file     Family History: The patient's family history includes Cancer in his paternal grandfather; Depression in his mother; Diabetes in his maternal grandfather; Hypercholesterolemia in his brother; Hypertension in his brother;  Liver disease in his father. ROS:   Please see the history of present illness.    All 14 point review of systems negative except as described per history of present illness  EKGs/Labs/Other Studies Reviewed:      Recent Labs: 03/08/2020: BUN 18; Potassium 4.4; Sodium 138 05/04/2020: ALT 20 11/30/2020: Creatinine, Ser 1.10  Recent Lipid Panel    Component Value Date/Time   CHOL 139 05/04/2020 1050   TRIG 103 05/04/2020 1050   HDL 47 05/04/2020 1050   CHOLHDL 3.0 05/04/2020 1050   LDLCALC 73 05/04/2020 1050    Physical Exam:    VS:  BP 102/64 (BP Location: Left Arm, Patient Position: Sitting)   Pulse 74   Ht 5\' 8"  (1.727 m)   Wt 245 lb 12.8 oz (111.5 kg)   SpO2 93%   BMI 37.37 kg/m     Wt Readings from Last 3 Encounters:  01/02/21 245 lb 12.8 oz (111.5 kg)  12/08/20 244 lb 9.6 oz (110.9 kg)  11/14/20 244 lb 3.2 oz (110.8 kg)     GEN:  Well nourished, well developed in no acute distress HEENT: Normal NECK: No JVD; No carotid bruits LYMPHATICS: No lymphadenopathy CARDIAC: RRR, systolic ejection murmur grade 2/6 best heard right upper portion of the sternum, S2 is still present, no rubs, no gallops RESPIRATORY:  Clear to auscultation without rales, wheezing or rhonchi  ABDOMEN: Soft, non-tender, non-distended MUSCULOSKELETAL:  No edema; No deformity  SKIN: Warm and dry LOWER EXTREMITIES: no swelling NEUROLOGIC:  Alert and oriented x 3 PSYCHIATRIC:  Normal affect   ASSESSMENT:    1. Peripheral vascular disease, unspecified (HCC) up to 79% p both sides carotids   2. Preop cardiovascular exam   3. Essential hypertension   4. Coronary artery disease involving native coronary artery of native heart without angina pectoris   5. Mixed hyperlipidemia    PLAN:    In order of problems listed above:  Peripheral vascular disease.  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  Surgery is contemplated.  He is here to be evaluated before the surgery Cardiovascular preop  evaluation.  He does have distal disease based on coronary CT angio.  I cannot determine if his exercise capacity is sufficient to safely go to the surgery.  I think there is some value in cardiac catheterization.  I will schedule him to have cardiac catheterization, procedure explained to him including all risk benefits as well as alternatives.  If he does have significant disease then we may favor doing carotic stenting even though I understand technically somewhat difficult.  If his cardiac catheterization shows no significant lesions.  I will consider doing upper heart carotic endarterectomy as planned. Essential hypertension blood pressure well controlled continue present management. Aortic stenosis echocardiogram from last year September showed mean gradient of 11 mmHg.  Physical exam does not suggest critical stenosis.   Medication Adjustments/Labs and Tests Ordered: Current medicines are reviewed at length with the patient today.  Concerns regarding medicines are outlined above.  No orders of the defined types were placed in this encounter.  Medication changes: No orders of the defined types were placed in this encounter.   Signed, Georgeanna Lea, MD, [facility] 01/02/2021 3:01 PM    [facility]

## 2021-01-02 NOTE — Progress Notes (Signed)
Cardiology Office Note:    Date:  01/02/2021   ID:  Russell Butler, DOB 12/09/1945, MRN [id number]  PCP:  [place], Sarah T, MD  Cardiologist:  Gypsy Balsam, MD    Referring MD: [place], Sarah T, MD   Chief Complaint  Patient presents with   Clearance TBD    Dr. Lorretta Harp    History of Present Illness:    Russell Butler is a 75 y.o. male with past medical history significant for coronary artery disease, he did have coronary CT angio which showed distal disease involving diagonal branches circumflex artery as well as PDA.  He was also identified to have significant bilateral carotic arterial disease surgery is contemplated.  I understand the preference is to do open carotic endarterectomy.  He came to my office today to talk about options.  He does have also history of hypertension, dyslipidemia.  He tells me he gets short of breath easily.  He tells me also that he can walk but I really cannot get enough information from him to see if he is able to do 4 METS.  He said he walks very slowly he tells me that he cannot go upstairs because he simply does not have stairs at home.  When asked him if he can keep up with me walking he tells me absolutely not because he cannot walk fast I am worried that he does not walk well enough to tell me that he is safe to have the surgery done.  I did review coronary CT angio with him which showed distal disease.  I think there is some value of cardiac catheterization.  Cardiac catheterization can precisely tell us location of the problem.  I did explain to him cardiac catheterization procedure including all risk benefits as well as alternatives.  He is willing to proceed.  Past Medical History:  Diagnosis Date   Carotid bruit 12/28/2019   Chest pain of uncertain etiology 12/28/2019   Coronary artery disease 03/24/2020   Dyspnea    Essential hypertension    Hyperlipidemia    Hypertension    Mixed hyperlipidemia    Peripheral vascular disease, unspecified  (HCC) up to 79% p both sides carotids 02/24/2020   Snoring    SOB (shortness of breath)    Tinnitus     Past Surgical History:  Procedure Laterality Date   NO PAST SURGERIES      Current Medications: Current Meds  Medication Sig   aspirin EC 81 MG tablet Take 81 mg by mouth daily. Swallow whole.   atorvastatin (LIPITOR) 80 MG tablet Take 1 tablet (80 mg total) by mouth daily.   BREO ELLIPTA 100-25 MCG/INH AEPB Inhale 2 puffs into the lungs daily.   hydrochlorothiazide (HYDRODIURIL) 25 MG tablet Take 25 mg by mouth daily.   lisinopril (ZESTRIL) 40 MG tablet Take 40 mg by mouth daily.   [DISCONTINUED] albuterol (VENTOLIN HFA) 108 (90 Base) MCG/ACT inhaler Inhale 2 puffs into the lungs every 4 (four) hours as needed for wheezing or shortness of breath.     Allergies:   Patient has no known allergies.   Social History   Socioeconomic History   Marital status: Unknown    Spouse name: Not on file   Number of children: Not on file   Years of education: Not on file   Highest education level: Not on file  Occupational History   Not on file  Tobacco Use   Smoking status: Former    Types: Cigarettes    Quit  date: 12/28/1979    Years since quitting: 41.0   Smokeless tobacco: Never  Substance and Sexual Activity   Alcohol use: Yes    Alcohol/week: 4.0 standard drinks    Types: 4 Glasses of wine per week   Drug use: Never   Sexual activity: Not on file  Other Topics Concern   Not on file  Social History Narrative   Not on file   Social Determinants of Health   Financial Resource Strain: Not on file  Food Insecurity: Not on file  Transportation Needs: Not on file  Physical Activity: Not on file  Stress: Not on file  Social Connections: Not on file     Family History: The patient's family history includes Cancer in his paternal grandfather; Depression in his mother; Diabetes in his maternal grandfather; Hypercholesterolemia in his brother; Hypertension in his brother;  Liver disease in his father. ROS:   Please see the history of present illness.    All 14 point review of systems negative except as described per history of present illness  EKGs/Labs/Other Studies Reviewed:      Recent Labs: 03/08/2020: BUN 18; Potassium 4.4; Sodium 138 05/04/2020: ALT 20 11/30/2020: Creatinine, Ser 1.10  Recent Lipid Panel    Component Value Date/Time   CHOL 139 05/04/2020 1050   TRIG 103 05/04/2020 1050   HDL 47 05/04/2020 1050   CHOLHDL 3.0 05/04/2020 1050   LDLCALC 73 05/04/2020 1050    Physical Exam:    VS:  BP 102/64 (BP Location: Left Arm, Patient Position: Sitting)   Pulse 74   Ht 5\' 8"  (1.727 m)   Wt 245 lb 12.8 oz (111.5 kg)   SpO2 93%   BMI 37.37 kg/m     Wt Readings from Last 3 Encounters:  01/02/21 245 lb 12.8 oz (111.5 kg)  12/08/20 244 lb 9.6 oz (110.9 kg)  11/14/20 244 lb 3.2 oz (110.8 kg)     GEN:  Well nourished, well developed in no acute distress HEENT: Normal NECK: No JVD; No carotid bruits LYMPHATICS: No lymphadenopathy CARDIAC: RRR, systolic ejection murmur grade 2/6 best heard right upper portion of the sternum, S2 is still present, no rubs, no gallops RESPIRATORY:  Clear to auscultation without rales, wheezing or rhonchi  ABDOMEN: Soft, non-tender, non-distended MUSCULOSKELETAL:  No edema; No deformity  SKIN: Warm and dry LOWER EXTREMITIES: no swelling NEUROLOGIC:  Alert and oriented x 3 PSYCHIATRIC:  Normal affect   ASSESSMENT:    1. Peripheral vascular disease, unspecified (HCC) up to 79% p both sides carotids   2. Preop cardiovascular exam   3. Essential hypertension   4. Coronary artery disease involving native coronary artery of native heart without angina pectoris   5. Mixed hyperlipidemia    PLAN:    In order of problems listed above:  Peripheral vascular disease.  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  Surgery is contemplated.  He is here to be evaluated before the surgery Cardiovascular preop  evaluation.  He does have distal disease based on coronary CT angio.  I cannot determine if his exercise capacity is sufficient to safely go to the surgery.  I think there is some value in cardiac catheterization.  I will schedule him to have cardiac catheterization, procedure explained to him including all risk benefits as well as alternatives.  If he does have significant disease then we may favor doing carotic stenting even though I understand technically somewhat difficult.  If his cardiac catheterization shows no significant lesions.  I will consider doing upper heart carotic endarterectomy as planned. Essential hypertension blood pressure well controlled continue present management. Aortic stenosis echocardiogram from last year September showed mean gradient of 11 mmHg.  Physical exam does not suggest critical stenosis.   Medication Adjustments/Labs and Tests Ordered: Current medicines are reviewed at length with the patient today.  Concerns regarding medicines are outlined above.  No orders of the defined types were placed in this encounter.  Medication changes: No orders of the defined types were placed in this encounter.   Signed, Georgeanna Lea, MD, [facility] 01/02/2021 3:01 PM    [facility]

## 2021-01-02 NOTE — Patient Instructions (Signed)
Medication Instructions:  Your physician recommends that you continue on your current medications as directed. Please refer to the Current Medication list given to you today.  *If you need a refill on your cardiac medications before your next appointment, please call your pharmacy*   Lab Work: Your physician recommends that you return for lab work in: TODAY BMP, CBC If you have labs (blood work) drawn today and your tests are completely normal, you will receive your results only by: MyChart Message (if you have MyChart) OR A paper copy in the mail If you have any lab test that is abnormal or we need to change your treatment, we will call you to review the results.   Testing/Procedures:  [facility] [place] [address] [place] [address] Dept: [phone] Loc: [phone]  Jozef Eisenbeis  01/02/2021  You are scheduled for a Cardiac Catheterization on Thursday, September 1 with Dr. Peter [place].  1. Please arrive at the [facility] (Main Entrance A) at [facility]: [address] [place], [place] [address] at 8:30 AM (This time is two hours before your procedure to ensure your preparation). Free valet parking service is available.   Special note: Every effort is made to have your procedure done on time. Please understand that emergencies sometimes delay scheduled procedures.  2. Diet: Do not eat solid foods after midnight.  The patient may have clear liquids until 5am upon the day of the procedure.  3. Labs: YOU HAD YOUR LABS DRAWN TODAY 01/02/2021  4. Medication instructions in preparation for your procedure:   Contrast Allergy: No  Stop taking, HTCZ (Hydrochlorothiazide) Thursday, September 1,2022  On the morning of your procedure, take your Aspirin and any morning medicines NOT listed above.  You may use sips of water.  5. Plan for one night stay--bring personal belongings. 6. Bring a  current list of your medications and current insurance cards. 7. You MUST have a responsible person to drive you home. 8. Someone MUST be with you the first 24 hours after you arrive home or your discharge will be delayed. 9. Please wear clothes that are easy to get on and off and wear slip-on shoes.  Thank you for allowing [place] to care for you!   -- [facility]    Follow-Up: At [facility], you and your health needs are our priority.  As part of our continuing mission to provide you with exceptional heart care, we have created designated Provider Care Teams.  These Care Teams include your primary Cardiologist (physician) and Advanced Practice Providers (APPs -  Physician Assistants and Nurse Practitioners) who all work together to provide you with the care you need, when you need it.  We recommend signing up for the patient portal called "MyChart".  Sign up information is provided on this After Visit Summary.  MyChart is used to connect with patients for Virtual Visits (Telemedicine).  Patients are able to view lab/test results, encounter notes, upcoming appointments, etc.  Non-urgent messages can be sent to your provider as well.   To learn more about what you can do with MyChart, go to [email or web address].    Your next appointment:   6 week(s)  The format for your next appointment:   In Person  Provider:   Dr. Bing Matter   Other Instructions

## 2021-01-03 LAB — CBC
Hematocrit: 40.8 % (ref 37.5–51.0)
Hemoglobin: 13.4 g/dL (ref 13.0–17.7)
MCH: 30.5 pg (ref 26.6–33.0)
MCHC: 32.8 g/dL (ref 31.5–35.7)
MCV: 93 fL (ref 79–97)
Platelets: 200 10*3/uL (ref 150–450)
RBC: 4.4 x10E6/uL (ref 4.14–5.80)
RDW: 12.8 % (ref 11.6–15.4)
WBC: 8 10*3/uL (ref 3.4–10.8)

## 2021-01-03 LAB — BASIC METABOLIC PANEL
BUN/Creatinine Ratio: 23 (ref 10–24)
BUN: 26 mg/dL (ref 8–27)
CO2: 19 mmol/L — ABNORMAL LOW (ref 20–29)
Calcium: 9.6 mg/dL (ref 8.6–10.2)
Chloride: 103 mmol/L (ref 96–106)
Creatinine, Ser: 1.12 mg/dL (ref 0.76–1.27)
Glucose: 127 mg/dL — ABNORMAL HIGH (ref 65–99)
Potassium: 4.3 mmol/L (ref 3.5–5.2)
Sodium: 137 mmol/L (ref 134–144)
eGFR: 69 mL/min/{1.73_m2} (ref >59)

## 2021-01-04 ENCOUNTER — Telehealth: Payer: Self-pay | Admitting: *Deleted

## 2021-01-04 NOTE — Telephone Encounter (Signed)
Cardiac catheterization scheduled at [facility] for: Thursday January 05, 2021 10:30 AM Arrive [facility] Main Entrance A [facility]) at: 8:30 AM   No solid food after midnight prior to cath, clear liquids until 5 AM day of procedure.  Medication instructions: Hold: -Hydrochlorothizide-AM of procedure  Except hold medications morning medications can be taken pre-cath with sips of water including aspirin 81 mg.    Confirmed patient has responsible adult to drive home post procedure and be with patient first 24 hours after arriving home.  Patients are allowed one visitor in the waiting room during the time they are at the hospital for their procedure. Both patient and visitor must wear a mask once they enter the hospital.   Patient reports does not currently have any symptoms concerning for COVID-19 and no household members with COVID-19 like illness.   Reviewed procedure/mask/visitor instructions with patient.

## 2021-01-05 ENCOUNTER — Ambulatory Visit (HOSPITAL_COMMUNITY)
Admission: RE | Admit: 2021-01-05 | Discharge: 2021-01-05 | Disposition: A | Discharge status: Home / Self Care | Payer: Medicare PPO | Source: Ambulatory Visit | Attending: Cardiology | Admitting: Cardiology

## 2021-01-05 ENCOUNTER — Encounter (HOSPITAL_COMMUNITY): Payer: Self-pay | Admitting: Cardiology

## 2021-01-05 ENCOUNTER — Encounter (HOSPITAL_COMMUNITY)
Admission: RE | Disposition: A | Discharge status: Home / Self Care | Payer: Self-pay | Source: Ambulatory Visit | Attending: Cardiology

## 2021-01-05 ENCOUNTER — Other Ambulatory Visit: Payer: Self-pay

## 2021-01-05 DIAGNOSIS — Z7951 Long term (current) use of inhaled steroids: Secondary | ICD-10-CM | POA: Insufficient documentation

## 2021-01-05 DIAGNOSIS — I350 Nonrheumatic aortic (valve) stenosis: Secondary | ICD-10-CM | POA: Diagnosis not present

## 2021-01-05 DIAGNOSIS — Z8249 Family history of ischemic heart disease and other diseases of the circulatory system: Secondary | ICD-10-CM | POA: Insufficient documentation

## 2021-01-05 DIAGNOSIS — Z7982 Long term (current) use of aspirin: Secondary | ICD-10-CM | POA: Diagnosis not present

## 2021-01-05 DIAGNOSIS — I739 Peripheral vascular disease, unspecified: Secondary | ICD-10-CM | POA: Diagnosis not present

## 2021-01-05 DIAGNOSIS — E782 Mixed hyperlipidemia: Secondary | ICD-10-CM | POA: Insufficient documentation

## 2021-01-05 DIAGNOSIS — I2510 Atherosclerotic heart disease of native coronary artery without angina pectoris: Secondary | ICD-10-CM | POA: Diagnosis not present

## 2021-01-05 DIAGNOSIS — I10 Essential (primary) hypertension: Secondary | ICD-10-CM | POA: Diagnosis not present

## 2021-01-05 DIAGNOSIS — R079 Chest pain, unspecified: Secondary | ICD-10-CM | POA: Insufficient documentation

## 2021-01-05 DIAGNOSIS — Z79899 Other long term (current) drug therapy: Secondary | ICD-10-CM | POA: Insufficient documentation

## 2021-01-05 DIAGNOSIS — I6529 Occlusion and stenosis of unspecified carotid artery: Secondary | ICD-10-CM | POA: Diagnosis not present

## 2021-01-05 DIAGNOSIS — Z87891 Personal history of nicotine dependence: Secondary | ICD-10-CM | POA: Diagnosis not present

## 2021-01-05 DIAGNOSIS — R0600 Dyspnea, unspecified: Secondary | ICD-10-CM

## 2021-01-05 HISTORY — PX: CORONARY PRESSURE/FFR STUDY: CATH118243

## 2021-01-05 HISTORY — PX: LEFT HEART CATH AND CORONARY ANGIOGRAPHY: CATH118249

## 2021-01-05 LAB — POCT ACTIVATED CLOTTING TIME: Activated Clotting Time: 294 seconds

## 2021-01-05 SURGERY — LEFT HEART CATH AND CORONARY ANGIOGRAPHY
Anesthesia: LOCAL

## 2021-01-05 MED ORDER — SODIUM CHLORIDE 0.9 % WEIGHT BASED INFUSION: 1.0000 mL/kg/h | INTRAVENOUS | Status: DC

## 2021-01-05 MED ORDER — LIDOCAINE HCL (PF) 1 % IJ SOLN
INTRAMUSCULAR | Status: DC | PRN
  Administered 2021-01-05: 2 mL

## 2021-01-05 MED ORDER — HEPARIN SODIUM (PORCINE) 1000 UNIT/ML IJ SOLN
INTRAMUSCULAR | Status: AC
  Filled 2021-01-05: qty 1

## 2021-01-05 MED ORDER — VERAPAMIL HCL 2.5 MG/ML IV SOLN
INTRAVENOUS | Status: AC
  Filled 2021-01-05: qty 2

## 2021-01-05 MED ORDER — SODIUM CHLORIDE 0.9% FLUSH: 3.0000 mL | INTRAVENOUS | Status: DC | PRN

## 2021-01-05 MED ORDER — ACETAMINOPHEN 325 MG PO TABS: 650.0000 mg | ORAL_TABLET | ORAL | Status: DC | PRN

## 2021-01-05 MED ORDER — HEPARIN SODIUM (PORCINE) 1000 UNIT/ML IJ SOLN
INTRAMUSCULAR | Status: DC | PRN
  Administered 2021-01-05 (×2): 5000 [IU] via INTRAVENOUS

## 2021-01-05 MED ORDER — SODIUM CHLORIDE 0.9 % IV SOLN: 250.0000 mL | INTRAVENOUS | Status: DC | PRN

## 2021-01-05 MED ORDER — NITROGLYCERIN 1 MG/10 ML FOR IR/CATH LAB
INTRA_ARTERIAL | Status: DC | PRN
  Administered 2021-01-05: 200 ug via INTRACORONARY

## 2021-01-05 MED ORDER — SODIUM CHLORIDE 0.9% FLUSH: 3.0000 mL | Freq: Two times a day (BID) | INTRAVENOUS | Status: DC

## 2021-01-05 MED ORDER — METOPROLOL SUCCINATE ER 25 MG PO TB24: 25.0000 mg | ORAL_TABLET | Freq: Every day | ORAL | 11 refills | Status: DC

## 2021-01-05 MED ORDER — ONDANSETRON HCL 4 MG/2ML IJ SOLN: 4.0000 mg | Freq: Four times a day (QID) | INTRAMUSCULAR | Status: DC | PRN

## 2021-01-05 MED ORDER — FENTANYL CITRATE (PF) 100 MCG/2ML IJ SOLN
INTRAMUSCULAR | Status: AC
  Filled 2021-01-05: qty 2

## 2021-01-05 MED ORDER — NITROGLYCERIN 1 MG/10 ML FOR IR/CATH LAB
INTRA_ARTERIAL | Status: AC
  Filled 2021-01-05: qty 10

## 2021-01-05 MED ORDER — LIDOCAINE HCL (PF) 1 % IJ SOLN
INTRAMUSCULAR | Status: AC
  Filled 2021-01-05: qty 30

## 2021-01-05 MED ORDER — HEPARIN (PORCINE) IN NACL 1000-0.9 UT/500ML-% IV SOLN
INTRAVENOUS | Status: AC
  Filled 2021-01-05: qty 1000

## 2021-01-05 MED ORDER — MIDAZOLAM HCL 2 MG/2ML IJ SOLN
INTRAMUSCULAR | Status: AC
  Filled 2021-01-05: qty 2

## 2021-01-05 MED ORDER — FENTANYL CITRATE (PF) 100 MCG/2ML IJ SOLN
INTRAMUSCULAR | Status: DC | PRN
  Administered 2021-01-05: 25 ug via INTRAVENOUS

## 2021-01-05 MED ORDER — ASPIRIN 81 MG PO CHEW: 81.0000 mg | CHEWABLE_TABLET | ORAL | Status: DC

## 2021-01-05 MED ORDER — IOHEXOL 350 MG/ML SOLN
INTRAVENOUS | Status: DC | PRN
  Administered 2021-01-05: 110 mL

## 2021-01-05 MED ORDER — MIDAZOLAM HCL 2 MG/2ML IJ SOLN
INTRAMUSCULAR | Status: DC | PRN
  Administered 2021-01-05: 1 mg via INTRAVENOUS

## 2021-01-05 MED ORDER — SODIUM CHLORIDE 0.9 % WEIGHT BASED INFUSION
3.0000 mL/kg/h | INTRAVENOUS | Status: AC
  Administered 2021-01-05: 3 mL/kg/h via INTRAVENOUS

## 2021-01-05 MED ORDER — HEPARIN (PORCINE) IN NACL 1000-0.9 UT/500ML-% IV SOLN
INTRAVENOUS | Status: DC | PRN
  Administered 2021-01-05 (×2): 500 mL

## 2021-01-05 MED ORDER — VERAPAMIL HCL 2.5 MG/ML IV SOLN
INTRAVENOUS | Status: DC | PRN
  Administered 2021-01-05: 10 mL via INTRA_ARTERIAL

## 2021-01-05 SURGICAL SUPPLY — 12 items
CATH 5FR JL3.5 JR4 ANG PIG MP (CATHETERS) ×2 IMPLANT
CATH VISTA GUIDE 6FR XBLAD3.5 (CATHETERS) ×2 IMPLANT
DEVICE RAD COMP TR BAND LRG (VASCULAR PRODUCTS) ×2 IMPLANT
GLIDESHEATH SLEND SS 6F .021 (SHEATH) ×2 IMPLANT
GUIDEWIRE INQWIRE 1.5J.035X260 (WIRE) ×2 IMPLANT
GUIDEWIRE PRESSURE X 175 (WIRE) ×2 IMPLANT
INQWIRE 1.5J .035X260CM (WIRE) ×4
KIT ESSENTIALS PG (KITS) ×2 IMPLANT
KIT HEART LEFT (KITS) ×2 IMPLANT
PACK CARDIAC CATHETERIZATION (CUSTOM PROCEDURE TRAY) ×2 IMPLANT
TRANSDUCER W/STOPCOCK (MISCELLANEOUS) ×2 IMPLANT
TUBING CIL FLEX 10 FLL-RA (TUBING) ×2 IMPLANT

## 2021-01-05 NOTE — Discharge Instructions (Addendum)
Add Toprol XL 25 mg daily 

## 2021-01-05 NOTE — Interval H&P Note (Signed)
History and Physical Interval Note:  01/05/2021 9:57 AM  Russell Butler  has presented today for surgery, with the diagnosis of cad - chest pain.  The various methods of treatment have been discussed with the patient and family. After consideration of risks, benefits and other options for treatment, the patient has consented to  Procedure(s): LEFT HEART CATH AND CORONARY ANGIOGRAPHY (N/A) as a surgical intervention.  The patient's history has been reviewed, patient examined, no change in status, stable for surgery.  I have reviewed the patient's chart and labs.  Questions were answered to the patient's satisfaction.   Cath Lab Visit (complete for each Cath Lab visit)  Clinical Evaluation Leading to the Procedure:   ACS: No.  Non-ACS:    Anginal Classification: CCS III  Anti-ischemic medical therapy: No Therapy  Non-Invasive Test Results: Intermediate-risk stress test findings: cardiac mortality 1-3%/year  Prior CABG: No previous CABG        Russell Butler [facility] 01/05/2021 9:57 AM

## 2021-01-15 NOTE — Progress Notes (Signed)
Office Visit    Patient Name: Russell Butler Date of Encounter: 01/16/2021  PCP:  Swaziland, Sarah T, MD   [facility]  Cardiologist:  Gypsy Balsam, MD  Advanced Practice Provider:  No care team member to display Electrophysiologist:  None     Chief Complaint    Kayler Rise is a 75 y.o. male with a hx of coronary artery disease, carotid artery disease, hypertension, hyperlipidemia, mild aortic stenosis presents today for follow up after cardiac cath   Past Medical History    Past Medical History:  Diagnosis Date   Carotid bruit 12/28/2019   Chest pain of uncertain etiology 12/28/2019   Coronary artery disease 03/24/2020   Dyspnea    Essential hypertension    Hyperlipidemia    Hypertension    Mixed hyperlipidemia    Peripheral vascular disease, unspecified (HCC) up to 79% p both sides carotids 02/24/2020   Snoring    SOB (shortness of breath)    Tinnitus    Past Surgical History:  Procedure Laterality Date   INTRAVASCULAR PRESSURE WIRE/FFR STUDY N/A 01/05/2021   Procedure: INTRAVASCULAR PRESSURE WIRE/FFR STUDY;  Surgeon: Swaziland, Peter M, MD;  [facility] INVASIVE CV LAB;  Service: Cardiovascular;  Laterality: N/A;   LEFT HEART CATH AND CORONARY ANGIOGRAPHY N/A 01/05/2021   Procedure: LEFT HEART CATH AND CORONARY ANGIOGRAPHY;  Surgeon: Swaziland, Peter M, MD;  Location: [facility] INVASIVE CV LAB;  Service: Cardiovascular;  Laterality: N/A;   NO PAST SURGERIES      Allergies  No Known Allergies  History of Present Illness    [name] is a 75 y.o. male with a hx of coronary artery disease, carotid artery disease, hypertension, hyperlipidemia, mild aortic stenosis last seen for cardiac catheterization 01/05/21.  He had followed routinely with Dr. Bing Matter. Last seen 01/02/21 for preop clearance for carotid endarterectomy. Given known CAD by cardiac CTA and exercise tolerance < 4 METS cardiac cath was arranged. Underwent LHC 01/05/21 with Dr. Swaziland  showing 2-vessel obstructive CAD (segmental area mid LAD with diffuse 60% narrowing and bridging of lesion 80% iFR 0.58 and severe disease LCx with 90% OM1 and OM2 and 99% distal LCx with right to left collaterals to distal LCx). He had normal LVEF and normal LVEDP. He had mild aortic stenosis with peak gradient 10-36mmHg. His anatomy was not well suited to PCI with significant bridging to mid LAD. As such, recommend focus on medical therapy with beta blocker. If still with anginal symptoms, consider CABG but thought to be able to safely proceed with carotid surgery if on good medical therapy.   He presents today for follow up. Cath site with ecchymosis but no soreness nor evidence of infection. Reports no chest pain, pressure, tightness. Dyspnea on exertion stable at baseline. Exercise tolerance limited due to orthopedic issues. Cardiac catheterization reviewed in detail. Tolerating Metoprolol without difficulty. Interested in seeing vascular surgery in [place] as he is not certain why referral was placed in [place] and this is very far for him from [place]. Notes he is due for repeat eye appointment but will occasional notice sensation of curtain closing over eye most notably after meal times.   EKGs/Labs/Other Studies Reviewed:   The following studies were reviewed today:  LHC 01/05/21   Mid LAD lesion is 75% stenosed.   1st Mrg lesion is 90% stenosed.   2nd Mrg lesion is 90% stenosed.   Mid Cx to Dist Cx lesion is 99% stenosed.   Prox RCA lesion is 30%  stenosed.   Mid RCA lesion is 30% stenosed.   The left ventricular systolic function is normal.   LV end diastolic pressure is normal.   The left ventricular ejection fraction is 55-65% by visual estimate.   There is mild aortic valve stenosis.   2 vessel obstructive CAD.     - There is a segmental area in the mid LAD with diffuse 60% narrowing. With bridging the lesion appears to be 80%. By flow wire analysis this is hemodynamically  significant with iFR of 0.58    - Severe disease in the LCx with 90% OM1 and OM2 disease. 99% distal LCx at OM2 bifurcation. There are right to left collaterals to the distal LCx 2. Normal LV function 3. Normal LVEDP 4. Mild aortic stenosis with peak gradient 10-15 mmHg   Plan: Images reviewed with interventional team. Patient's anatomy is not well suited to PCI with significant bridging in the mid LAD. Bifurcation disease in the LCx. Initial therapy should focus on optimal medical therapy with beta blocker especially. If he remains symptomatic despite optimal medical therapy he should be considered for CABG. I think on good medical therapy he could safely proceed with carotid surgery.    Recommendations  Antiplatelet/Anticoag Recommend Aspirin 81mg  daily for moderate CAD.   CT neck 11/30/20   IMPRESSION: Bilateral common and internal carotid artery atherosclerosis. There is up to 82% stenosis of the proximal right ICA and 60% stenosis of the proximal left ICA.   Carotid Duplex 09/22/20 Summary:  Right Carotid: Velocities in the right ICA are consistent with a 80-99%                 stenosis. Non-hemodynamically significant plaque <50% noted  in                the CCA. The ECA appears >50% stenosed.   Left Carotid: Velocities in the left ICA are consistent with a 60-79%  stenosis.               Non-hemodynamically significant plaque <50% noted in the  CCA. The                ECA appears >50% stenosed.   Vertebrals:  Right vertebral artery demonstrates antegrade flow. Left  vertebral              artery demonstrates no discernable flow.  Subclavians: Right subclavian artery flow was disturbed. Normal flow               hemodynamics were seen in the left subclavian artery.   EKG:  No EKG today  Recent Labs: 05/04/2020: ALT 20 01/02/2021: BUN 26; Creatinine, Ser 1.12; Hemoglobin 13.4; Platelets 200; Potassium 4.3; Sodium 137  Recent Lipid Panel    Component Value Date/Time    CHOL 139 05/04/2020 1050   TRIG 103 05/04/2020 1050   HDL 47 05/04/2020 1050   CHOLHDL 3.0 05/04/2020 1050   LDLCALC 73 05/04/2020 1050     Home Medications   Current Meds  Medication Sig   aspirin EC 81 MG tablet Take 81 mg by mouth daily. Swallow whole.   atorvastatin (LIPITOR) 80 MG tablet Take 1 tablet (80 mg total) by mouth daily.   BREO ELLIPTA 100-25 MCG/INH AEPB Inhale 2 puffs into the lungs daily.   hydrochlorothiazide (HYDRODIURIL) 25 MG tablet Take 25 mg by mouth daily.   lisinopril (ZESTRIL) 40 MG tablet Take 40 mg by mouth daily.   metoprolol succinate (TOPROL XL) 25 MG 24  hr tablet Take 1 tablet (25 mg total) by mouth daily.     Review of Systems      All other systems reviewed and are otherwise negative except as noted above.  Physical Exam    VS:  BP 114/62   Pulse 62   Ht 5\' 8"  (1.727 m)   Wt 243 lb 6.4 oz (110.4 kg)   SpO2 99%   BMI 37.01 kg/m  , BMI Body mass index is 37.01 kg/m.  Wt Readings from Last 3 Encounters:  01/16/21 243 lb 6.4 oz (110.4 kg)  01/05/21 244 lb (110.7 kg)  01/02/21 245 lb 12.8 oz (111.5 kg)     GEN: Well nourished, well developed, in no acute distress. HEENT: normal. Neck: Supple, no JVD, or masses. Bilateral carotid bruit.  Cardiac: RRR, no murmurs, rubs, or gallops. No clubbing, cyanosis, edema.  Radials/PT 2+ and equal bilaterally.  Respiratory:  Respirations regular and unlabored, clear to auscultation bilaterally. GI: Soft, nontender, nondistended. MS: No deformity or atrophy. Skin: Warm and dry, no rash. Neuro:  Strength and sensation are intact. Psych: Normal affect.  Assessment & Plan    Preoperative clearance - Recent cardiac cath with two vessel CAD not amenable to PCI with significant bridging in mid LAD. Per Dr. 01/04/21 "I think on good medical therapy he could safely proceed with carotid surgery". He is tolerating Aspirin, Metoprolol, Atorvastatin without difficulty. He is deemed acceptable risk for planned  procedure without cardiovascular testing.   CAD - Stable with no anginal symptoms. No indication for ischemic evaluation.  Recent cath with severe two vessel CAD not good PCI candidate. GDMT includes aspirin, atorvastatin, metoprolol. Per Dr. Swaziland after recent cath, plan for medical therapy unless with refractory symptoms. Heart healthy diet and regular cardiovascular exercise encouraged.    Carotid artery disease - CT 11/2020 with 82% stenosis of prox right ICA and 60% stenosis of prox left ICA. plan for carotid endarterectomy with [facility]. Previously evaluated by [facility] and requests to be seen by [facility] group due to proximity. Referral placed. Clearance, as above. Reports very rare amaurosis fugax only after mealtimes. No near syncope nor syncope. Continue aspirin, statin.   HTN - BP well controlled. Does not occasional lightheadedness since addition of Toprol. Will reduce HCTZ to 12.5mg  QD. Continue Toprol 25mg  QD, Lisinopril 40mg  QD. If BP persistently low, consider discontinuation of HCTZ vs reduced dose Lisinopril.   Mild aortic stenosis - By cardiac cath 01/05/21. Plan for echocardiogram for monitoring 01/2022.   HLD, LDL goal <70 - 04/2020 total cholesterol 139, HDL 47, LDL 73. Discussed lipid lowering diet and the importance of exercise. Continue Atorvastatin. Recommend repeat lipid panel with next fasting blood work and if LDL >70 consider addition of Zetia.   Disposition: Follow up in 1 month(s) as scheduled with Dr. 03/07/21. Signed, 02/2022, NP 01/16/2021, 11:05 AM [facility]

## 2021-01-16 ENCOUNTER — Other Ambulatory Visit: Payer: Self-pay

## 2021-01-16 ENCOUNTER — Encounter (HOSPITAL_BASED_OUTPATIENT_CLINIC_OR_DEPARTMENT_OTHER): Payer: Self-pay | Admitting: Family

## 2021-01-16 ENCOUNTER — Ambulatory Visit (HOSPITAL_BASED_OUTPATIENT_CLINIC_OR_DEPARTMENT_OTHER): Payer: Medicare PPO | Admitting: Family

## 2021-01-16 VITALS — BP 114/62 | HR 62 | Ht 68.0 in | Wt 243.4 lb

## 2021-01-16 DIAGNOSIS — I25118 Atherosclerotic heart disease of native coronary artery with other forms of angina pectoris: Secondary | ICD-10-CM | POA: Diagnosis not present

## 2021-01-16 DIAGNOSIS — I6523 Occlusion and stenosis of bilateral carotid arteries: Secondary | ICD-10-CM

## 2021-01-16 DIAGNOSIS — I350 Nonrheumatic aortic (valve) stenosis: Secondary | ICD-10-CM

## 2021-01-16 DIAGNOSIS — I10 Essential (primary) hypertension: Secondary | ICD-10-CM | POA: Diagnosis not present

## 2021-01-16 DIAGNOSIS — E785 Hyperlipidemia, unspecified: Secondary | ICD-10-CM

## 2021-01-16 MED ORDER — HYDROCHLOROTHIAZIDE 25 MG PO TABS: 12.5000 mg | ORAL_TABLET | Freq: Every day | ORAL | 1 refills | Status: DC

## 2021-01-16 NOTE — Patient Instructions (Addendum)
Medication Instructions:  Your physician has recommended you make the following change in your medication:   REDUCE Hydrochlorothiazide to half tablet (12.5mg ) once daily   *If you need a refill on your cardiac medications before your next appointment, please call your pharmacy*  Lab Work: None ordered today   Testing/Procedures: Your physician has requested that you have an echocardiogram. Echocardiography is a painless test that uses sound waves to create images of your heart. It provides your doctor with information about the size and shape of your heart and how well your heart's chambers and valves are working. This procedure takes approximately one hour. There are no restrictions for this procedure.    Follow-Up: At [facility], you and your health needs are our priority.  As part of our continuing mission to provide you with exceptional heart care, we have created designated Provider Care Teams.  These Care Teams include your primary Cardiologist (physician) and Advanced Practice Providers (APPs -  Physician Assistants and Nurse Practitioners) who all work together to provide you with the care you need, when you need it.  We recommend signing up for the patient portal called "MyChart".  Sign up information is provided on this After Visit Summary.  MyChart is used to connect with patients for Virtual Visits (Telemedicine).  Patients are able to view lab/test results, encounter notes, upcoming appointments, etc.  Non-urgent messages can be sent to your provider as well.   To learn more about what you can do with MyChart, go to [email or web address].    Your next appointment:   As scheduled with Dr. Bing Matter  Other Instructions  We placed a referral to the Vascular group in [place]. If you don't hear from them by the end of the week, the contact information is available below.   [facility] [address] [place],  [place]  [address] Main:  [phone] Fax: [phone]

## 2021-02-07 ENCOUNTER — Ambulatory Visit: Payer: Medicare PPO | Admitting: Vascular Surgery

## 2021-02-07 ENCOUNTER — Other Ambulatory Visit: Payer: Self-pay

## 2021-02-07 ENCOUNTER — Encounter: Payer: Self-pay | Admitting: Vascular Surgery

## 2021-02-07 VITALS — BP 136/73 | HR 75 | Temp 97.6°F | Resp 18 | Ht 68.0 in | Wt 244.0 lb

## 2021-02-07 DIAGNOSIS — I6523 Occlusion and stenosis of bilateral carotid arteries: Secondary | ICD-10-CM | POA: Diagnosis not present

## 2021-02-07 NOTE — Progress Notes (Signed)
Patient name: Russell Butler MRN: [id number] DOB: September 03, 1945 Sex: male  REASON FOR CONSULT: Evaluate carotid artery disease  HPI: Russell Butler is a 75 y.o. male, with history of hypertension, hyperlipidemia, coronary artery disease that presents for evaluation of carotid artery disease.  Patient denies any history of TIAs or strokes.  He was evaluated by Dr. Gilda Crease with [facility] and offered a right carotid endarterectomy.  He states he would prefer to have surgery here in [place] given this is closer to home.  His ultrasound carotid duplex on 09/22/2020 showed a greater than 80% right ICA stenosis and moderate 60 to 79% left ICA stenosis.  CTA neck on 11/30/2020 showed a greater than 80% right ICA stenosis and a 60% left carotid stenosis by CT.  He denies any history of neck surgery or neck radiation.  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  Past Medical History:  Diagnosis Date   Carotid bruit 12/28/2019   Chest pain of uncertain etiology 12/28/2019   Coronary artery disease 03/24/2020   Dyspnea    Essential hypertension    Hyperlipidemia    Hypertension    Mixed hyperlipidemia    Peripheral vascular disease, unspecified (HCC) up to 79% p both sides carotids 02/24/2020   Snoring    SOB (shortness of breath)    Tinnitus     Past Surgical History:  Procedure Laterality Date   INTRAVASCULAR PRESSURE WIRE/FFR STUDY N/A 01/05/2021   Procedure: INTRAVASCULAR PRESSURE WIRE/FFR STUDY;  Surgeon: Swaziland, Peter M, MD;  Location: [facility] INVASIVE CV LAB;  Service: Cardiovascular;  Laterality: N/A;   LEFT HEART CATH AND CORONARY ANGIOGRAPHY N/A 01/05/2021   Procedure: LEFT HEART CATH AND CORONARY ANGIOGRAPHY;  Surgeon: Swaziland, Peter M, MD;  Location: [facility] INVASIVE CV LAB;  Service: Cardiovascular;  Laterality: N/A;   NO PAST SURGERIES      Family History  Problem Relation Age of Onset   Depression Mother    Liver disease Father    Hypertension Brother     Hypercholesterolemia Brother    Diabetes Maternal Grandfather    Cancer Paternal Grandfather     SOCIAL HISTORY: Social History   Socioeconomic History   Marital status: Unknown    Spouse name: Not on file   Number of children: Not on file   Years of education: Not on file   Highest education level: Not on file  Occupational History   Not on file  Tobacco Use   Smoking status: Former    Types: Cigarettes    Quit date: 12/28/1979    Years since quitting: 41.1   Smokeless tobacco: Never  Substance and Sexual Activity   Alcohol use: Yes    Alcohol/week: 4.0 standard drinks    Types: 4 Glasses of wine per week   Drug use: Never   Sexual activity: Not on file  Other Topics Concern   Not on file  Social History Narrative   Not on file   Social Determinants of Health   Financial Resource Strain: Not on file  Food Insecurity: Not on file  Transportation Needs: Not on file  Physical Activity: Not on file  Stress: Not on file  Social Connections: Not on file  Intimate Partner Violence: Not on file    No Known Allergies  Current Outpatient Medications  Medication Sig Dispense Refill   aspirin EC 81 MG tablet Take 81 mg by mouth daily. Swallow whole.     atorvastatin (LIPITOR) 80 MG tablet Take 1  tablet (80 mg total) by mouth daily. 90 tablet 3   BREO ELLIPTA 100-25 MCG/INH AEPB Inhale 2 puffs into the lungs daily.     hydrochlorothiazide (HYDRODIURIL) 25 MG tablet Take 0.5 tablets (12.5 mg total) by mouth daily. 45 tablet 1   lisinopril (ZESTRIL) 40 MG tablet Take 40 mg by mouth daily.     metoprolol succinate (TOPROL XL) 25 MG 24 hr tablet Take 1 tablet (25 mg total) by mouth daily. 30 tablet 11   No current facility-administered medications for this visit.    REVIEW OF SYSTEMS:  [X]  denotes positive finding, [ ]  denotes negative finding Cardiac  Comments:  Chest pain or chest pressure:    Shortness of breath upon exertion:    Short of breath when lying flat:     Irregular heart rhythm:        Vascular    Pain in calf, thigh, or hip brought on by ambulation:    Pain in feet at night that wakes you up from your sleep:     Blood clot in your veins:    Leg swelling:         Pulmonary    Oxygen at home:    Productive cough:     Wheezing:         Neurologic    Sudden weakness in arms or legs:     Sudden numbness in arms or legs:     Sudden onset of difficulty speaking or slurred speech:    Temporary loss of vision in one eye:     Problems with dizziness:         Gastrointestinal    Blood in stool:     Vomited blood:         Genitourinary    Burning when urinating:     Blood in urine:        Psychiatric    Major depression:         Hematologic    Bleeding problems:    Problems with blood clotting too easily:        Skin    Rashes or ulcers:        Constitutional    Fever or chills:      PHYSICAL EXAM: Vitals:   02/07/21 1411 02/07/21 1413  BP: 126/75 136/73  Pulse: 74 75  Resp: 18   Temp: 97.6 F (36.4 C)   TempSrc: Temporal   SpO2: 96%   Weight: 244 lb (110.7 kg)   Height: 5\' 8"  (1.727 m)     GENERAL: The patient is a well-nourished male, in no acute distress. The vital signs are documented above. CARDIAC: There is a regular rate and rhythm.  VASCULAR:  Palpable radial pulses both upper extremities PULMONARY: No respiratory distress. ABDOMEN: Soft and non-tender. MUSCULOSKELETAL: There are no major deformities or cyanosis. NEUROLOGIC: No focal weakness or paresthesias are detected.  Cranial nerves II through XII grossly intact. SKIN: There are no ulcers or rashes noted. PSYCHIATRIC: The patient has a normal affect.  DATA:   CTA neck reviewed 11/30/2020 and greater than 80% right ICA stenosis by my measurement with a retropharyngeal carotid.  Assessment/Plan:  75 year old male presents with a greater than 80% asymptomatic right internal carotid stenosis.  I agree with the recommendation from Dr. that  patient would benefit from a right carotid endarterectomy given this is a fairly calcified lesion and a fairly low bifurcation.  We discussed this for stroke risk reduction.  Discussed guidelines are  for surgery for greater than 80% stenosis when asymptomatic.  I have offered to do this at [facility].  He has recently undergone cardiac cath and he was not a candidate for PCI and medical therapy has been recommended.  He denies any chest pain at this time.  He does have some chronic shortness of breath at baseline only with exertion.  Risk and benefits of carotid surgery were discussed with him including 1% risk of perioperative stroke and risk of anesthesia including MI etc.  We will get him scheduled for 10/19.   [name], MD [facility] Office: [phone]

## 2021-02-08 ENCOUNTER — Other Ambulatory Visit: Payer: Self-pay

## 2021-02-08 DIAGNOSIS — I6523 Occlusion and stenosis of bilateral carotid arteries: Secondary | ICD-10-CM

## 2021-02-14 ENCOUNTER — Encounter (HOSPITAL_COMMUNITY): Payer: Self-pay

## 2021-02-14 ENCOUNTER — Encounter (HOSPITAL_COMMUNITY)
Admission: RE | Admit: 2021-02-14 | Discharge: 2021-02-14 | Disposition: A | Discharge status: Home / Self Care | Payer: Medicare PPO | Source: Ambulatory Visit | Attending: Vascular Surgery | Admitting: Vascular Surgery

## 2021-02-14 ENCOUNTER — Other Ambulatory Visit: Payer: Self-pay

## 2021-02-14 DIAGNOSIS — I350 Nonrheumatic aortic (valve) stenosis: Secondary | ICD-10-CM | POA: Diagnosis not present

## 2021-02-14 DIAGNOSIS — Z79899 Other long term (current) drug therapy: Secondary | ICD-10-CM | POA: Insufficient documentation

## 2021-02-14 DIAGNOSIS — I6523 Occlusion and stenosis of bilateral carotid arteries: Secondary | ICD-10-CM

## 2021-02-14 DIAGNOSIS — Z7951 Long term (current) use of inhaled steroids: Secondary | ICD-10-CM | POA: Diagnosis not present

## 2021-02-14 DIAGNOSIS — I2510 Atherosclerotic heart disease of native coronary artery without angina pectoris: Secondary | ICD-10-CM | POA: Diagnosis not present

## 2021-02-14 DIAGNOSIS — Z7982 Long term (current) use of aspirin: Secondary | ICD-10-CM | POA: Diagnosis not present

## 2021-02-14 DIAGNOSIS — Z01812 Encounter for preprocedural laboratory examination: Secondary | ICD-10-CM | POA: Insufficient documentation

## 2021-02-14 DIAGNOSIS — I6521 Occlusion and stenosis of right carotid artery: Secondary | ICD-10-CM | POA: Insufficient documentation

## 2021-02-14 HISTORY — DX: Gastro-esophageal reflux disease without esophagitis: K21.9

## 2021-02-14 HISTORY — DX: Unspecified asthma, uncomplicated: J45.909

## 2021-02-14 HISTORY — DX: Cardiac murmur, unspecified: R01.1

## 2021-02-14 LAB — TYPE AND SCREEN
ABO/RH(D): A POS
Antibody Screen: NEGATIVE

## 2021-02-14 LAB — CBC
HCT: 40 % (ref 39.0–52.0)
Hemoglobin: 13.6 g/dL (ref 13.0–17.0)
MCH: 31.9 pg (ref 26.0–34.0)
MCHC: 34 g/dL (ref 30.0–36.0)
MCV: 93.9 fL (ref 80.0–100.0)
Platelets: 211 10*3/uL (ref 150–400)
RBC: 4.26 MIL/uL (ref 4.22–5.81)
RDW: 13 % (ref 11.5–15.5)
WBC: 8.9 10*3/uL (ref 4.0–10.5)
nRBC: 0 % (ref 0.0–0.2)

## 2021-02-14 LAB — BLOOD GAS, ARTERIAL
Acid-base deficit: 0.1 mmol/L (ref 0.0–2.0)
Bicarbonate: 24 mmol/L (ref 20.0–28.0)
Drawn by: 58793
FIO2: 21
O2 Saturation: 98.3 %
Patient temperature: 37
pCO2 arterial: 38.6 mmHg (ref 32.0–48.0)
pH, Arterial: 7.41 (ref 7.350–7.450)
pO2, Arterial: 119 mmHg — ABNORMAL HIGH (ref 83.0–108.0)

## 2021-02-14 LAB — URINALYSIS, ROUTINE W REFLEX MICROSCOPIC
Bilirubin Urine: NEGATIVE
Glucose, UA: NEGATIVE mg/dL
Hgb urine dipstick: NEGATIVE
Ketones, ur: NEGATIVE mg/dL
Leukocytes,Ua: NEGATIVE
Nitrite: NEGATIVE
Protein, ur: NEGATIVE mg/dL
Specific Gravity, Urine: 1.012 (ref 1.005–1.030)
pH: 6 (ref 5.0–8.0)

## 2021-02-14 LAB — APTT: aPTT: 25 seconds (ref 24–36)

## 2021-02-14 LAB — SURGICAL PCR SCREEN
MRSA, PCR: NEGATIVE
Staphylococcus aureus: NEGATIVE

## 2021-02-14 LAB — COMPREHENSIVE METABOLIC PANEL
ALT: 24 U/L (ref 0–44)
AST: 28 U/L (ref 15–41)
Albumin: 3.6 g/dL (ref 3.5–5.0)
Alkaline Phosphatase: 45 U/L (ref 38–126)
Anion gap: 9 (ref 5–15)
BUN: 17 mg/dL (ref 8–23)
CO2: 23 mmol/L (ref 22–32)
Calcium: 9.2 mg/dL (ref 8.9–10.3)
Chloride: 104 mmol/L (ref 98–111)
Creatinine, Ser: 1.1 mg/dL (ref 0.61–1.24)
GFR, Estimated: >60 mL/min (ref >60)
Glucose, Bld: 112 mg/dL — ABNORMAL HIGH (ref 70–99)
Potassium: 4.3 mmol/L (ref 3.5–5.1)
Sodium: 136 mmol/L (ref 135–145)
Total Bilirubin: 0.6 mg/dL (ref 0.3–1.2)
Total Protein: 6.3 g/dL — ABNORMAL LOW (ref 6.5–8.1)

## 2021-02-14 LAB — PROTIME-INR
INR: 0.9 (ref 0.8–1.2)
Prothrombin Time: 12.4 seconds (ref 11.4–15.2)

## 2021-02-14 NOTE — Progress Notes (Signed)
Surgical Instructions    Your procedure is scheduled on Wednesday, October 19th, 2022.   Report to [facility] Main Entrance "A" at 09:00 A.M., then check in with the Admitting office.  Call this number if you have problems the morning of surgery:  [phone]   If you have any questions prior to your surgery date call [phone]: Open Monday-Friday 8am-4pm    Remember:  Do not eat or drink after midnight the night before your surgery    Take these medicines the morning of surgery with A SIP OF WATER:  atorvastatin (LIPITOR) metoprolol succinate (TOPROL XL)  BREO ELLIPTA - please, bring the inhaler with you the day of surgery   Follow your surgeon's instructions on when to stop Aspirin.  If no instructions were given by your surgeon then you will need to call the office to get those instructions.     As of today, STOP taking any Aspirin (unless otherwise instructed by your surgeon) Aleve, Naproxen, Ibuprofen, Motrin, Advil, Goody's, BC's, all herbal medications, fish oil, and all vitamins.    After your COVID test   You are not required to quarantine however you are required to wear a well-fitting mask when you are out and around people not in your household.  If your mask becomes wet or soiled, replace with a new one.  Wash your hands often with soap and water for 20 seconds or clean your hands with an alcohol-based hand sanitizer that contains at least 60% alcohol.  Do not share personal items.  Notify your provider: if you are in close contact with someone who has COVID  or if you develop a fever of 100.4 or greater, sneezing, cough, sore throat, shortness of breath or body aches.    The day of surgery:          Do not wear jewelry Do not wear lotions, powders, colognes, or deodorant. Men may shave face and neck. Do not bring valuables to the hospital.              [facility] is not responsible for any belongings or valuables.  Do NOT Smoke (Tobacco/Vaping)  24  hours prior to your procedure  If you use a CPAP at night, you may bring your mask for your overnight stay.   Contacts, glasses, hearing aids, dentures or partials may not be worn into surgery, please bring cases for these belongings   For patients admitted to the hospital, discharge time will be determined by your treatment team.   Patients discharged the day of surgery will not be allowed to drive home, and someone needs to stay with them for 24 hours.  NO VISITORS WILL BE ALLOWED IN PRE-OP WHERE PATIENTS ARE PREPPED FOR SURGERY.  ONLY 1 SUPPORT PERSON MAY BE PRESENT IN THE WAITING ROOM WHILE YOU ARE IN SURGERY.  IF YOU ARE TO BE ADMITTED, ONCE YOU ARE IN YOUR ROOM YOU WILL BE ALLOWED TWO (2) VISITORS. 1 (ONE) VISITOR MAY STAY OVERNIGHT BUT MUST ARRIVE TO THE ROOM BY 8pm.  Minor children may have two parents present. Special consideration for safety and communication needs will be reviewed on a case by case basis.  Special instructions:    Oral Hygiene is also important to reduce your risk of infection.  Remember - BRUSH YOUR TEETH THE MORNING OF SURGERY WITH YOUR REGULAR TOOTHPASTE   [place]- Preparing For Surgery  Before surgery, you can play an important role. Because skin is not sterile, your skin needs to  be as free of germs as possible. You can reduce the number of germs on your skin by washing with CHG (chlorahexidine gluconate) Soap before surgery.  CHG is an antiseptic cleaner which kills germs and bonds with the skin to continue killing germs even after washing.     Please do not use if you have an allergy to CHG or antibacterial soaps. If your skin becomes reddened/irritated stop using the CHG.  Do not shave (including legs and underarms) for at least 48 hours prior to first CHG shower. It is OK to shave your face.  Please follow these instructions carefully.     Shower the NIGHT BEFORE SURGERY and the MORNING OF SURGERY with CHG Soap.   If you chose to wash your hair,  wash your hair first as usual with your normal shampoo. After you shampoo, rinse your hair and body thoroughly to remove the shampoo.  Then [employer] and genitals (private parts) with your normal soap and rinse thoroughly to remove soap.  After that Use CHG Soap as you would any other liquid soap. You can apply CHG directly to the skin and wash gently with a scrungie or a clean washcloth.   Apply the CHG Soap to your body ONLY FROM THE NECK DOWN.  Do not use on open wounds or open sores. Avoid contact with your eyes, ears, mouth and genitals (private parts). Wash Face and genitals (private parts)  with your normal soap.   Wash thoroughly, paying special attention to the area where your surgery will be performed.  Thoroughly rinse your body with warm water from the neck down.  DO NOT shower/wash with your normal soap after using and rinsing off the CHG Soap.  Pat yourself dry with a CLEAN TOWEL.  Wear CLEAN PAJAMAS to bed the night before surgery  Place CLEAN SHEETS on your bed the night before your surgery  DO NOT SLEEP WITH PETS.   Day of Surgery:  Take a shower with CHG soap. Wear Clean/Comfortable clothing the morning of surgery Do not apply any deodorants/lotions.   Remember to brush your teeth WITH YOUR REGULAR TOOTHPASTE.   Please read over the following fact sheets that you were given.

## 2021-02-14 NOTE — Progress Notes (Signed)
PCP - [place], Sarah, MD Cardiologist - Gypsy Balsam, MD  PPM/ICD - denies Device Orders - n/a Rep Notified - n/a  Chest x-ray - n/a EKG - 01/06/2021 Stress Test - 12/2019 per patient - records requested ECHO - 01/18/2020 Cardiac Cath - 01/05/2021  Sleep Study - denies CPAP - n/a  Fasting Blood Sugar - n/a  Blood Thinner Instructions: n/a  Aspirin Instructions: Aspirin - will not hold Aspirin prior to surgery (per patient)  ERAS Protcol - n/a  COVID TEST- no; the test will be done the day of surgery  Anesthesia review: yes - cardiac history  Patient denies shortness of breath, fever, cough and chest pain at PAT appointment   All instructions explained to the patient, with a verbal understanding of the material. Patient agrees to go over the instructions while at home for a better understanding. Patient also instructed to self quarantine after being tested for COVID-19. The opportunity to ask questions was provided.

## 2021-02-15 NOTE — Anesthesia Preprocedure Evaluation (Addendum)
Anesthesia Evaluation  Patient identified by MRN, date of birth, ID band Patient awake    Reviewed: Allergy & Precautions, NPO status , Patient's Chart, lab work & pertinent test results, reviewed documented beta blocker date and time   History of Anesthesia Complications Negative for: history of anesthetic complications  Airway Mallampati: II  TM Distance: >3 FB Neck ROM: Full    Dental  (+) Teeth Intact, Dental Advisory Given   Pulmonary COPD,  COPD inhaler, former smoker,  02/22/2021 SARS coronavirus NEG   breath sounds clear to auscultation       Cardiovascular hypertension, Pt. on medications and Pt. on home beta blockers (-) angina+ Peripheral Vascular Disease  CAD: 80% LAD, 99% Cx.  + Valvular Problems/Murmurs (mild) AS  Rhythm:Regular Rate:Normal  01/2021 Cath: 2 vessel obstructive CAD.     - There is a segmental area in the mid LAD with diffuse 60% narrowing. With bridging the lesion appears to be 80%.   - Severe disease in the LCx with 90% OM1 and OM2 disease. 99% distal LCx at OM2 bifurcation. There are right to left collaterals to the distal LCx 2. Normal LV function 3. Normal LVEDP 4. Mild aortic stenosis with peak gradient 10-15 mmHg  '21 ECHO: 60-65%. LV has normal function, no regional  wall motion abnormalities.  mild LVH. Grade I DD, Mild aortic stenosis is present. Aortic valve mean gradient measures 11.0 mmHg. Aortic valve peak gradient  measures 20.6 mmHg  Cardiology: Initial therapy should focus on optimal medical therapy with beta blocker especially. If he remains symptomatic despite optimal medical therapy he should be considered for CABG. I think on good medical therapy he could safely proceed with carotid surgery.    Neuro/Psych negative neurological ROS     GI/Hepatic Neg liver ROS, GERD  Controlled,  Endo/Other  Morbid obesity  Renal/GU negative Renal ROS     Musculoskeletal    Abdominal (+) + obese,   Peds  Hematology negative hematology ROS (+)   Anesthesia Other Findings   Reproductive/Obstetrics                           Anesthesia Physical Anesthesia Plan  ASA: 4  Anesthesia Plan: General   Post-op Pain Management:    Induction: Intravenous  PONV Risk Score and Plan: 2 and Ondansetron and Dexamethasone  Airway Management Planned: Oral ETT  Additional Equipment: Arterial line  Intra-op Plan:   Post-operative Plan: Extubation in OR  Informed Consent: I have reviewed the patients History and Physical, chart, labs and discussed the procedure including the risks, benefits and alternatives for the proposed anesthesia with the patient or authorized representative who has indicated his/her understanding and acceptance.     Dental advisory given  Plan Discussed with: CRNA and Surgeon  Anesthesia Plan Comments: (See APP note by Joslyn Hy, FNP  Pt understands and accepts risk of MI, stroke, death. Wishes to proceed with carotid endarterectomy.  Dr. Chestine Spore agrees)      Anesthesia Quick Evaluation

## 2021-02-15 NOTE — Progress Notes (Signed)
Anesthesia Chart Review:   Case: [id number] Date/Time: 02/22/21 1046   Procedure: RIGHT CAROTID ENDARTERECTOMY (Right)   Anesthesia type: General   Pre-op diagnosis: RIGHT CAROTID STENOSIS   [facility] / [facility]   Surgeons: [name], MD       DISCUSSION: Pt is 75 years old with hx CAD (2 vessel obstructive disease - not suitable for PCI 01/05/21 - see notes on cath report below), aortic stenosis (mild), HTN, asthma  Pt has DOE and poor exercise tolerance (<4 METS). Per comment on cath report, pt can proceed with carotid surgery   VS: BP (!) 122/41   Pulse 65   Temp (!) 36.4 C (Oral)   Resp 18   Ht 5\' 8"  (1.727 m)   Wt 112 kg   SpO2 100%   BMI 37.56 kg/m   PROVIDERS: - PCP is , Sarah T, MD - Cardiologist is [place], MD   LABS: Labs reviewed: Acceptable for surgery. (all labs ordered are listed, but only abnormal results are displayed)  Labs Reviewed  COMPREHENSIVE METABOLIC PANEL - Abnormal; Notable for the following components:      Result Value   Glucose, Bld 112 (*)    Total Protein 6.3 (*)    All other components within normal limits  BLOOD GAS, ARTERIAL - Abnormal; Notable for the following components:   pO2, Arterial 119 (*)    Allens test (pass/fail) BRACHIAL ARTERY (*)    All other components within normal limits  SURGICAL PCR SCREEN  CBC  PROTIME-INR  APTT  URINALYSIS, ROUTINE W REFLEX MICROSCOPIC  TYPE AND SCREEN     IMAGES: CT angio neck 12/01/20:  - Bilateral common and internal carotid artery atherosclerosis. There is up to 82% stenosis of the proximal right ICA and 60% stenosis of the proximal left ICA.   EKG 01/05/21: NSR   CV: Cardiac cath 01/05/21:  Mid LAD lesion is 75% stenosed.   1st Mrg lesion is 90% stenosed.   2nd Mrg lesion is 90% stenosed.   Mid Cx to Dist Cx lesion is 99% stenosed.   Prox RCA lesion is 30% stenosed.   Mid RCA lesion is 30% stenosed.   The left ventricular systolic function is  normal.   LV end diastolic pressure is normal.   The left ventricular ejection fraction is 55-65% by visual estimate.   There is mild aortic valve stenosis.   2 vessel obstructive CAD.     - There is a segmental area in the mid LAD with diffuse 60% narrowing. With bridging the lesion appears to be 80%. By flow wire analysis this is hemodynamically significant with iFR of 0.58    - Severe disease in the LCx with 90% OM1 and OM2 disease. 99% distal LCx at OM2 bifurcation. There are right to left collaterals to the distal LCx 2. Normal LV function 3. Normal LVEDP 4. Mild aortic stenosis with peak gradient 10-15 mmHg   Plan: Images reviewed with interventional team. Patient's anatomy is not well suited to PCI with significant bridging in the mid LAD. Bifurcation disease in the LCx. Initial therapy should focus on optimal medical therapy with beta blocker especially. If he remains symptomatic despite optimal medical therapy he should be considered for CABG. I think on good medical therapy he could safely proceed with carotid surgery.     Carotid 03/07/21 09/22/20 - Right Carotid: Velocities in the right ICA are consistent with a 80-99% stenosis. Non-hemodynamically significant plaque <50% noted in  the CCA. The ECA appears >50% stenosed.  - Left Carotid: Velocities in the left ICA are consistent with a 60-79%  stenosis. Non-hemodynamically significant plaque <50% noted in the  CCA. The ECA appears >50% stenosed.  - Vertebrals:  Right vertebral artery demonstrates antegrade flow. Left  vertebral artery demonstrates no discernable flow.  - Subclavians: Right subclavian artery flow was disturbed. Normal flow hemodynamics were seen in the left subclavian artery.    Echo 01/18/20:  1. Left ventricular ejection fraction, by estimation, is 60 to 65%. The left ventricle has normal function. The left ventricle has no regional wall motion abnormalities. There is mild left ventricular hypertrophy. Left ventricular  diastolic parameters are consistent with Grade I diastolic dysfunction (impaired relaxation).  2. Right ventricular systolic function is normal. The right ventricular size is normal. There is normal pulmonary artery systolic pressure.  3. Left atrial size was mildly dilated.  4. Mild aortic valve stenosis.    Past Medical History:  Diagnosis Date   Asthma    Carotid bruit 12/28/2019   Chest pain of uncertain etiology 12/28/2019   Coronary artery disease 03/24/2020   Dyspnea    Essential hypertension    GERD (gastroesophageal reflux disease)    Heart murmur    Hyperlipidemia    Hypertension    Mixed hyperlipidemia    Peripheral vascular disease, unspecified (HCC) up to 79% p both sides carotids 02/24/2020   Snoring    SOB (shortness of breath)    Tinnitus     Past Surgical History:  Procedure Laterality Date   CARDIAC CATHETERIZATION     INTRAVASCULAR PRESSURE WIRE/FFR STUDY N/A 01/05/2021   Procedure: INTRAVASCULAR PRESSURE WIRE/FFR STUDY;  Surgeon: [place], Peter M, MD;  Location: [facility]AS[facility];  Service: Cardiovascular;  Laterality: N/A;   LEFT HEART CATH AND CORONARY ANGIOGRAPHY N/A 01/05/2021   Procedure: LEFT HEART CATH AND CORONARY ANGIOGRAPHY;  Surgeon: [place], Peter M, MD;  Location: [facility] INVASIVE CV LAB;  Service: Cardiovascular;  Laterality: N/A;   NO PAST SURGERIES      MEDICATIONS:  aspirin EC 81 MG tablet   atorvastatin (LIPITOR) 80 MG tablet   BREO ELLIPTA 100-25 MCG/INH AEPB   hydrochlorothiazide (HYDRODIURIL) 25 MG tablet   lisinopril (ZESTRIL) 40 MG tablet   metoprolol succinate (TOPROL XL) 25 MG 24 hr tablet   No current facility-administered medications for this encounter.    If no changes, I anticipate pt can proceed with surgery as scheduled.   Rica Mast, PhD, FNP-BC [facility]/Anesthesiology Phone: [phone] 02/15/2021 2:39 PM

## 2021-02-20 ENCOUNTER — Ambulatory Visit: Payer: Medicare PPO | Admitting: Cardiology

## 2021-02-20 ENCOUNTER — Other Ambulatory Visit: Payer: Self-pay

## 2021-02-20 ENCOUNTER — Encounter: Payer: Self-pay | Admitting: Cardiology

## 2021-02-20 VITALS — BP 126/72 | HR 76 | Ht 68.0 in | Wt 243.3 lb

## 2021-02-20 DIAGNOSIS — I739 Peripheral vascular disease, unspecified: Secondary | ICD-10-CM | POA: Diagnosis not present

## 2021-02-20 DIAGNOSIS — I2510 Atherosclerotic heart disease of native coronary artery without angina pectoris: Secondary | ICD-10-CM | POA: Diagnosis not present

## 2021-02-20 DIAGNOSIS — I10 Essential (primary) hypertension: Secondary | ICD-10-CM | POA: Diagnosis not present

## 2021-02-20 NOTE — Patient Instructions (Signed)

## 2021-02-20 NOTE — Progress Notes (Signed)
Cardiology Office Note:    Date:  02/20/2021   ID:  Russell Butler, DOB 1946/01/24, MRN [id number]  PCP:  [place], Sarah T, MD  Cardiologist:  Gypsy Balsam, MD    Referring MD: [place], Sarah T, MD   Chief Complaint  Patient presents with   Follow-up  I need to have carotic endarterectomy  History of Present Illness:    Russell Butler is a 75 y.o. male with past medical history significant for coronary artery disease he was find to have significant coronary artery stenosis based on CT.  He end up having cardiac catheterization done which showed significant stenosis of mid LAD however apex was assisted with bridging, he also had significant bifurcation of his obtuse marginal 1 branch and distal circumflex artery.  He was felt not to be candidate for intervention because of technical difficulty getting access to those lesions on top of that he did not have any chest pain tightness squeezing pressure burning chest.  What prompted investigation is the fact that he required carotic endarterectomy.  He was find to have up to 99% stenosis in mid right carotic artery with a peak velocity of 402 cm/second, on the left side there is up to 79% stenosis.  Overall likely he does not have any TIA or CVA symptoms however there is some history of amaurosis fugax previously.  Clearly he is at high risk of having CVA.  He did see vascular surgeon and he is scheduled to have surgery on Wednesday.  His ability to exercise is limited because of orthopedic issues.  Past Medical History:  Diagnosis Date   Asthma    Carotid bruit 12/28/2019   Chest pain of uncertain etiology 12/28/2019   Coronary artery disease 03/24/2020   Dyspnea    Essential hypertension    GERD (gastroesophageal reflux disease)    Heart murmur    Hyperlipidemia    Hypertension    Mixed hyperlipidemia    Peripheral vascular disease, unspecified (HCC) up to 79% p both sides carotids 02/24/2020   Snoring    SOB (shortness of breath)     Tinnitus     Past Surgical History:  Procedure Laterality Date   CARDIAC CATHETERIZATION     INTRAVASCULAR PRESSURE WIRE/FFR STUDY N/A 01/05/2021   Procedure: INTRAVASCULAR PRESSURE WIRE/FFR STUDY;  Surgeon: [place], Peter M, MD;  [facility] LAB;  Service: Cardiovascular;  Laterality: N/A;   LEFT HEART CATH AND CORONARY ANGIOGRAPHY N/A 01/05/2021   Procedure: LEFT HEART CATH AND CORONARY ANGIOGRAPHY;  Surgeon: [place], Peter M, MD;  Location: [facility] INVASIVE CV LAB;  Service: Cardiovascular;  Laterality: N/A;   NO PAST SURGERIES      Current Medications: Current Meds  Medication Sig   aspirin EC 81 MG tablet Take 81 mg by mouth daily. Swallow whole.   atorvastatin (LIPITOR) 80 MG tablet Take 1 tablet (80 mg total) by mouth daily.   BREO ELLIPTA 100-25 MCG/INH AEPB Inhale 1 puff into the lungs in the morning and at bedtime.   hydrochlorothiazide (HYDRODIURIL) 25 MG tablet Take 0.5 tablets (12.5 mg total) by mouth daily.   lisinopril (ZESTRIL) 40 MG tablet Take 40 mg by mouth daily.   metoprolol succinate (TOPROL XL) 25 MG 24 hr tablet Take 1 tablet (25 mg total) by mouth daily. (Patient taking differently: Take 12.5 mg by mouth daily.)     Allergies:   Patient has no known allergies.   Social History   Socioeconomic History   Marital status: Single  Spouse name: Not on file   Number of children: Not on file   Years of education: Not on file   Highest education level: Not on file  Occupational History   Not on file  Tobacco Use   Smoking status: Former    Types: Cigarettes    Quit date: 12/28/1979    Years since quitting: 41.1   Smokeless tobacco: Never  Substance and Sexual Activity   Alcohol use: Yes    Alcohol/week: 10.0 standard drinks    Types: 10 Glasses of wine per week   Drug use: Never   Sexual activity: Not on file  Other Topics Concern   Not on file  Social History Narrative   Not on file   Social Determinants of Health   Financial Resource  Strain: Not on file  Food Insecurity: Not on file  Transportation Needs: Not on file  Physical Activity: Not on file  Stress: Not on file  Social Connections: Not on file     Family History: The patient's family history includes Cancer in his paternal grandfather; Depression in his mother; Diabetes in his maternal grandfather; Hypercholesterolemia in his brother; Hypertension in his brother; Liver disease in his father. ROS:   Please see the history of present illness.    All 14 point review of systems negative except as described per history of present illness  EKGs/Labs/Other Studies Reviewed:      Recent Labs: 02/14/2021: ALT 24; BUN 17; Creatinine, Ser 1.10; Hemoglobin 13.6; Platelets 211; Potassium 4.3; Sodium 136  Recent Lipid Panel    Component Value Date/Time   CHOL 139 05/04/2020 1050   TRIG 103 05/04/2020 1050   HDL 47 05/04/2020 1050   CHOLHDL 3.0 05/04/2020 1050   LDLCALC 73 05/04/2020 1050    Physical Exam:    VS:  BP 126/72 (BP Location: Left Arm, Patient Position: Sitting)   Pulse 76   Ht 5\' 8"  (1.727 m)   Wt 243 lb 4.8 oz (110.4 kg)   SpO2 95%   BMI 36.99 kg/m     Wt Readings from Last 3 Encounters:  02/20/21 243 lb 4.8 oz (110.4 kg)  02/14/21 247 lb (112 kg)  02/07/21 244 lb (110.7 kg)     GEN:  Well nourished, well developed in no acute distress HEENT: Normal NECK: No JVD; No carotid bruits LYMPHATICS: No lymphadenopathy CARDIAC: RRR, no murmurs, no rubs, no gallops RESPIRATORY:  Clear to auscultation without rales, wheezing or rhonchi  ABDOMEN: Soft, non-tender, non-distended MUSCULOSKELETAL:  No edema; No deformity  SKIN: Warm and dry LOWER EXTREMITIES: no swelling NEUROLOGIC:  Alert and oriented x 3 PSYCHIATRIC:  Normal affect   ASSESSMENT:    1. Peripheral vascular disease, unspecified (HCC) up to 79% p both sides carotids   2. Essential hypertension   3. Coronary artery disease involving native coronary artery of native heart  without angina pectoris    PLAN:    In order of problems listed above:  Peripheral vascular disease bilateral carotic artery stenosis.  Plan is to proceed with surgery next Wednesday.  He does have significant coronary artery disease which is not amendable for intervention before clearly he does have some risk for the surgery but I do not think we have any other options.  I of course we need to pay special attention to his hemodynamics during the surgery and in the recovery phase.  In the meantime we will continue antiplatelet therapy as well as statin. Coronary artery disease asymptomatic stable on appropriate  medications which I will continue.  No chest pain tightness squeezing pressure burning chest Essential hypertension blood pressure well controlled continue present management Dyslipidemia I did review medications he is taking Lipitor 80 which I will continue.  His LDL from last year was 73 HDL 47 we will repeat fasting lipid profile.   Medication Adjustments/Labs and Tests Ordered: Current medicines are reviewed at length with the patient today.  Concerns regarding medicines are outlined above.  No orders of the defined types were placed in this encounter.  Medication changes: No orders of the defined types were placed in this encounter.   Signed, Georgeanna Lea, MD, [facility] 02/20/2021 1:33 PM    [facility]

## 2021-02-22 ENCOUNTER — Encounter (HOSPITAL_COMMUNITY)
Admission: RE | Disposition: A | Discharge status: Home / Self Care | Payer: Self-pay | Source: Ambulatory Visit | Attending: Vascular Surgery

## 2021-02-22 ENCOUNTER — Other Ambulatory Visit: Payer: Self-pay

## 2021-02-22 ENCOUNTER — Encounter (HOSPITAL_COMMUNITY): Payer: Self-pay | Admitting: Vascular Surgery

## 2021-02-22 ENCOUNTER — Inpatient Hospital Stay (HOSPITAL_COMMUNITY): Payer: Medicare PPO | Admitting: Emergency Medicine

## 2021-02-22 ENCOUNTER — Inpatient Hospital Stay (HOSPITAL_COMMUNITY)
Admission: RE | Admit: 2021-02-22 | Discharge: 2021-02-23 | DRG: 039 | Disposition: A | Discharge status: Home / Self Care | Payer: Medicare PPO | Source: Ambulatory Visit | Attending: Vascular Surgery | Admitting: Vascular Surgery

## 2021-02-22 ENCOUNTER — Inpatient Hospital Stay (HOSPITAL_COMMUNITY): Payer: Medicare PPO | Admitting: Anesthesiology

## 2021-02-22 DIAGNOSIS — H9319 Tinnitus, unspecified ear: Secondary | ICD-10-CM | POA: Diagnosis present

## 2021-02-22 DIAGNOSIS — I6521 Occlusion and stenosis of right carotid artery: Principal | ICD-10-CM | POA: Diagnosis present

## 2021-02-22 DIAGNOSIS — Z8249 Family history of ischemic heart disease and other diseases of the circulatory system: Secondary | ICD-10-CM | POA: Diagnosis not present

## 2021-02-22 DIAGNOSIS — Z7951 Long term (current) use of inhaled steroids: Secondary | ICD-10-CM | POA: Diagnosis not present

## 2021-02-22 DIAGNOSIS — I739 Peripheral vascular disease, unspecified: Secondary | ICD-10-CM | POA: Diagnosis present

## 2021-02-22 DIAGNOSIS — I2510 Atherosclerotic heart disease of native coronary artery without angina pectoris: Secondary | ICD-10-CM | POA: Diagnosis present

## 2021-02-22 DIAGNOSIS — Z23 Encounter for immunization: Secondary | ICD-10-CM | POA: Diagnosis present

## 2021-02-22 DIAGNOSIS — E782 Mixed hyperlipidemia: Secondary | ICD-10-CM | POA: Diagnosis present

## 2021-02-22 DIAGNOSIS — K219 Gastro-esophageal reflux disease without esophagitis: Secondary | ICD-10-CM | POA: Diagnosis present

## 2021-02-22 DIAGNOSIS — Z833 Family history of diabetes mellitus: Secondary | ICD-10-CM | POA: Diagnosis not present

## 2021-02-22 DIAGNOSIS — I10 Essential (primary) hypertension: Secondary | ICD-10-CM | POA: Diagnosis present

## 2021-02-22 DIAGNOSIS — Z79899 Other long term (current) drug therapy: Secondary | ICD-10-CM

## 2021-02-22 DIAGNOSIS — Z20822 Contact with and (suspected) exposure to covid-19: Secondary | ICD-10-CM | POA: Diagnosis present

## 2021-02-22 DIAGNOSIS — Z7982 Long term (current) use of aspirin: Secondary | ICD-10-CM | POA: Diagnosis not present

## 2021-02-22 HISTORY — PX: PATCH ANGIOPLASTY: SHX6230

## 2021-02-22 HISTORY — PX: ENDARTERECTOMY: SHX5162

## 2021-02-22 LAB — CREATININE, SERUM
Creatinine, Ser: 1.09 mg/dL (ref 0.61–1.24)
GFR, Estimated: >60 mL/min (ref >60)

## 2021-02-22 LAB — CBC
HCT: 38.7 % — ABNORMAL LOW (ref 39.0–52.0)
Hemoglobin: 12.8 g/dL — ABNORMAL LOW (ref 13.0–17.0)
MCH: 31.2 pg (ref 26.0–34.0)
MCHC: 33.1 g/dL (ref 30.0–36.0)
MCV: 94.4 fL (ref 80.0–100.0)
Platelets: 168 10*3/uL (ref 150–400)
RBC: 4.1 MIL/uL — ABNORMAL LOW (ref 4.22–5.81)
RDW: 13.1 % (ref 11.5–15.5)
WBC: 10.9 10*3/uL — ABNORMAL HIGH (ref 4.0–10.5)
nRBC: 0 % (ref 0.0–0.2)

## 2021-02-22 LAB — ABO/RH: ABO/RH(D): A POS

## 2021-02-22 LAB — SARS CORONAVIRUS 2 BY RT PCR (HOSPITAL ORDER, PERFORMED IN ~~LOC~~ HOSPITAL LAB): SARS Coronavirus 2: NEGATIVE

## 2021-02-22 SURGERY — ENDARTERECTOMY, CAROTID
Anesthesia: General | Site: Neck | Laterality: Right

## 2021-02-22 MED ORDER — HEPARIN 6000 UNIT IRRIGATION SOLUTION
Status: AC
  Filled 2021-02-22: qty 500

## 2021-02-22 MED ORDER — DEXAMETHASONE SODIUM PHOSPHATE 10 MG/ML IJ SOLN
INTRAMUSCULAR | Status: AC
  Filled 2021-02-22: qty 1

## 2021-02-22 MED ORDER — NITROGLYCERIN 0.2 MG/ML ON CALL CATH LAB
INTRAVENOUS | Status: DC | PRN
  Administered 2021-02-22: 40 ug via INTRAVENOUS

## 2021-02-22 MED ORDER — MEPERIDINE HCL 25 MG/ML IJ SOLN: 6.2500 mg | INTRAMUSCULAR | Status: DC | PRN

## 2021-02-22 MED ORDER — POTASSIUM CHLORIDE CRYS ER 20 MEQ PO TBCR
20.0000 meq | EXTENDED_RELEASE_TABLET | Freq: Every day | ORAL | Status: DC | PRN
Start: 2021-02-22 — End: 2021-02-23

## 2021-02-22 MED ORDER — NITROGLYCERIN IN D5W 200-5 MCG/ML-% IV SOLN
INTRAVENOUS | Status: DC | PRN
  Administered 2021-02-22: 16.6 ug/min via INTRAVENOUS

## 2021-02-22 MED ORDER — SODIUM CHLORIDE 0.9 % IV SOLN: 500.0000 mL | Freq: Once | INTRAVENOUS | Status: DC | PRN

## 2021-02-22 MED ORDER — CHLORHEXIDINE GLUCONATE 0.12 % MT SOLN
15.0000 mL | Freq: Once | OROMUCOSAL | Status: AC
  Administered 2021-02-22: 15 mL via OROMUCOSAL
  Filled 2021-02-22: qty 15

## 2021-02-22 MED ORDER — PROPOFOL 10 MG/ML IV BOLUS
INTRAVENOUS | Status: DC | PRN
  Administered 2021-02-22: 100 mg via INTRAVENOUS

## 2021-02-22 MED ORDER — HEPARIN SODIUM (PORCINE) 1000 UNIT/ML IJ SOLN
INTRAMUSCULAR | Status: AC
  Filled 2021-02-22: qty 3

## 2021-02-22 MED ORDER — LACTATED RINGERS IV SOLN: INTRAVENOUS | Status: DC | PRN

## 2021-02-22 MED ORDER — PROMETHAZINE HCL 25 MG/ML IJ SOLN: 6.2500 mg | INTRAMUSCULAR | Status: DC | PRN

## 2021-02-22 MED ORDER — ACETAMINOPHEN 500 MG PO TABS
1000.0000 mg | ORAL_TABLET | Freq: Once | ORAL | Status: AC
  Administered 2021-02-22: 1000 mg via ORAL

## 2021-02-22 MED ORDER — LIDOCAINE 2% (20 MG/ML) 5 ML SYRINGE
INTRAMUSCULAR | Status: DC | PRN
Start: 2021-02-22 — End: 2021-02-22
  Administered 2021-02-22: 35 mg via INTRAVENOUS

## 2021-02-22 MED ORDER — CHLORHEXIDINE GLUCONATE CLOTH 2 % EX PADS: 6.0000 | MEDICATED_PAD | Freq: Once | CUTANEOUS | Status: DC

## 2021-02-22 MED ORDER — POLYETHYLENE GLYCOL 3350 17 G PO PACK: 17.0000 g | PACK | Freq: Every day | ORAL | Status: DC | PRN

## 2021-02-22 MED ORDER — FENTANYL CITRATE (PF) 100 MCG/2ML IJ SOLN
INTRAMUSCULAR | Status: DC | PRN
  Administered 2021-02-22: 250 ug via INTRAVENOUS

## 2021-02-22 MED ORDER — PROTAMINE SULFATE 10 MG/ML IV SOLN
INTRAVENOUS | Status: AC
  Filled 2021-02-22: qty 5

## 2021-02-22 MED ORDER — BISACODYL 5 MG PO TBEC: 5.0000 mg | DELAYED_RELEASE_TABLET | Freq: Every day | ORAL | Status: DC | PRN

## 2021-02-22 MED ORDER — LIDOCAINE HCL (PF) 1 % IJ SOLN
INTRAMUSCULAR | Status: AC
  Filled 2021-02-22: qty 30

## 2021-02-22 MED ORDER — SODIUM CHLORIDE 0.9 % IV SOLN
0.0125 ug/kg/min | INTRAVENOUS | Status: AC
  Administered 2021-02-22: .1 ug/kg/min via INTRAVENOUS
  Filled 2021-02-22: qty 2000

## 2021-02-22 MED ORDER — PNEUMOCOCCAL VAC POLYVALENT 25 MCG/0.5ML IJ INJ
0.5000 mL | INJECTION | INTRAMUSCULAR | Status: AC
  Administered 2021-02-23: 0.5 mL via INTRAMUSCULAR
  Filled 2021-02-22: qty 0.5

## 2021-02-22 MED ORDER — INFLUENZA VAC A&B SA ADJ QUAD 0.5 ML IM PRSY
0.5000 mL | PREFILLED_SYRINGE | INTRAMUSCULAR | Status: AC
  Administered 2021-02-23: 0.5 mL via INTRAMUSCULAR
  Filled 2021-02-22: qty 0.5

## 2021-02-22 MED ORDER — SODIUM CHLORIDE 0.9 % IV SOLN: INTRAVENOUS | Status: DC

## 2021-02-22 MED ORDER — OXYCODONE HCL 5 MG/5ML PO SOLN
5.0000 mg | Freq: Once | ORAL | Status: DC | PRN
Start: 2021-02-22 — End: 2021-02-22

## 2021-02-22 MED ORDER — FENTANYL CITRATE (PF) 250 MCG/5ML IJ SOLN
INTRAMUSCULAR | Status: AC
  Filled 2021-02-22: qty 5

## 2021-02-22 MED ORDER — ONDANSETRON HCL 4 MG/2ML IJ SOLN
INTRAMUSCULAR | Status: AC
  Filled 2021-02-22: qty 2

## 2021-02-22 MED ORDER — ALUM & MAG HYDROXIDE-SIMETH 200-200-20 MG/5ML PO SUSP: 15.0000 mL | ORAL | Status: DC | PRN

## 2021-02-22 MED ORDER — HEMOSTATIC AGENTS (NO CHARGE) OPTIME
TOPICAL | Status: DC | PRN
  Administered 2021-02-22: 1 via TOPICAL

## 2021-02-22 MED ORDER — HEPARIN SODIUM (PORCINE) 5000 UNIT/ML IJ SOLN
5000.0000 [IU] | Freq: Three times a day (TID) | INTRAMUSCULAR | Status: DC
  Administered 2021-02-23: 5000 [IU] via SUBCUTANEOUS
  Filled 2021-02-22: qty 1

## 2021-02-22 MED ORDER — PHENYLEPHRINE HCL-NACL 20-0.9 MG/250ML-% IV SOLN
INTRAVENOUS | Status: DC | PRN
  Administered 2021-02-22: 60 ug/min via INTRAVENOUS

## 2021-02-22 MED ORDER — FENTANYL CITRATE (PF) 100 MCG/2ML IJ SOLN: 25.0000 ug | INTRAMUSCULAR | Status: DC | PRN

## 2021-02-22 MED ORDER — PHENYLEPHRINE 40 MCG/ML (10ML) SYRINGE FOR IV PUSH (FOR BLOOD PRESSURE SUPPORT)
PREFILLED_SYRINGE | INTRAVENOUS | Status: DC | PRN
  Administered 2021-02-22: 80 ug via INTRAVENOUS

## 2021-02-22 MED ORDER — CEFAZOLIN SODIUM-DEXTROSE 2-4 GM/100ML-% IV SOLN
2.0000 g | INTRAVENOUS | Status: AC
  Administered 2021-02-22: 2 g via INTRAVENOUS
  Filled 2021-02-22: qty 100

## 2021-02-22 MED ORDER — ACETAMINOPHEN 500 MG PO TABS
ORAL_TABLET | ORAL | Status: AC
  Filled 2021-02-22: qty 2

## 2021-02-22 MED ORDER — ONDANSETRON HCL 4 MG/2ML IJ SOLN
INTRAMUSCULAR | Status: DC | PRN
  Administered 2021-02-22: 4 mg via INTRAVENOUS

## 2021-02-22 MED ORDER — MAGNESIUM SULFATE 2 GM/50ML IV SOLN: 2.0000 g | Freq: Every day | INTRAVENOUS | Status: DC | PRN

## 2021-02-22 MED ORDER — OXYCODONE HCL 5 MG PO TABS: 5.0000 mg | ORAL_TABLET | Freq: Once | ORAL | Status: DC | PRN

## 2021-02-22 MED ORDER — PANTOPRAZOLE SODIUM 40 MG PO TBEC
40.0000 mg | DELAYED_RELEASE_TABLET | Freq: Every day | ORAL | Status: DC
  Administered 2021-02-23: 40 mg via ORAL
  Filled 2021-02-22: qty 1

## 2021-02-22 MED ORDER — PROPOFOL 10 MG/ML IV BOLUS
INTRAVENOUS | Status: AC
  Filled 2021-02-22: qty 20

## 2021-02-22 MED ORDER — FLUTICASONE FUROATE-VILANTEROL 100-25 MCG/ACT IN AEPB
1.0000 | INHALATION_SPRAY | Freq: Two times a day (BID) | RESPIRATORY_TRACT | Status: DC
  Administered 2021-02-23: 1 via RESPIRATORY_TRACT
  Filled 2021-02-22: qty 28

## 2021-02-22 MED ORDER — HYDRALAZINE HCL 20 MG/ML IJ SOLN: 5.0000 mg | INTRAMUSCULAR | Status: DC | PRN

## 2021-02-22 MED ORDER — ORAL CARE MOUTH RINSE: 15.0000 mL | Freq: Once | OROMUCOSAL | Status: AC

## 2021-02-22 MED ORDER — HEPARIN 6000 UNIT IRRIGATION SOLUTION
Status: DC | PRN
  Administered 2021-02-22: 1

## 2021-02-22 MED ORDER — METOPROLOL TARTRATE 5 MG/5ML IV SOLN: 2.0000 mg | INTRAVENOUS | Status: DC | PRN

## 2021-02-22 MED ORDER — SUGAMMADEX SODIUM 200 MG/2ML IV SOLN
INTRAVENOUS | Status: DC | PRN
  Administered 2021-02-22: 200 mg via INTRAVENOUS

## 2021-02-22 MED ORDER — OXYCODONE-ACETAMINOPHEN 5-325 MG PO TABS: 1.0000 | ORAL_TABLET | ORAL | Status: DC | PRN

## 2021-02-22 MED ORDER — ACETAMINOPHEN 325 MG PO TABS: 325.0000 mg | ORAL_TABLET | ORAL | Status: DC | PRN

## 2021-02-22 MED ORDER — METOPROLOL SUCCINATE 12.5 MG HALF TABLET
12.5000 mg | ORAL_TABLET | Freq: Every day | ORAL | Status: DC
  Administered 2021-02-23: 12.5 mg via ORAL
  Filled 2021-02-22: qty 1

## 2021-02-22 MED ORDER — CEFAZOLIN SODIUM-DEXTROSE 2-4 GM/100ML-% IV SOLN
2.0000 g | Freq: Three times a day (TID) | INTRAVENOUS | Status: AC
  Administered 2021-02-22 – 2021-02-23 (×2): 2 g via INTRAVENOUS
  Filled 2021-02-22 (×2): qty 100

## 2021-02-22 MED ORDER — ACETAMINOPHEN 325 MG RE SUPP
325.0000 mg | RECTAL | Status: DC | PRN
  Filled 2021-02-22: qty 2

## 2021-02-22 MED ORDER — EPHEDRINE SULFATE-NACL 50-0.9 MG/10ML-% IV SOSY
PREFILLED_SYRINGE | INTRAVENOUS | Status: DC | PRN
  Administered 2021-02-22 (×2): 5 mg via INTRAVENOUS

## 2021-02-22 MED ORDER — HEPARIN SODIUM (PORCINE) 1000 UNIT/ML IJ SOLN
INTRAMUSCULAR | Status: AC
  Filled 2021-02-22: qty 1

## 2021-02-22 MED ORDER — GUAIFENESIN-DM 100-10 MG/5ML PO SYRP: 15.0000 mL | ORAL_SOLUTION | ORAL | Status: DC | PRN

## 2021-02-22 MED ORDER — ROCURONIUM BROMIDE 10 MG/ML (PF) SYRINGE
PREFILLED_SYRINGE | INTRAVENOUS | Status: AC
  Filled 2021-02-22: qty 10

## 2021-02-22 MED ORDER — 0.9 % SODIUM CHLORIDE (POUR BTL) OPTIME
TOPICAL | Status: DC | PRN
  Administered 2021-02-22: 2000 mL

## 2021-02-22 MED ORDER — MORPHINE SULFATE (PF) 2 MG/ML IV SOLN
2.0000 mg | INTRAVENOUS | Status: DC | PRN
Start: 2021-02-22 — End: 2021-02-23

## 2021-02-22 MED ORDER — ROCURONIUM BROMIDE 10 MG/ML (PF) SYRINGE
PREFILLED_SYRINGE | INTRAVENOUS | Status: DC | PRN
  Administered 2021-02-22: 70 mg via INTRAVENOUS
  Administered 2021-02-22: 15 mg via INTRAVENOUS

## 2021-02-22 MED ORDER — ATORVASTATIN CALCIUM 80 MG PO TABS
80.0000 mg | ORAL_TABLET | Freq: Every day | ORAL | Status: DC
  Administered 2021-02-23: 80 mg via ORAL
  Filled 2021-02-22: qty 1

## 2021-02-22 MED ORDER — LISINOPRIL 20 MG PO TABS
40.0000 mg | ORAL_TABLET | Freq: Every day | ORAL | Status: DC
  Administered 2021-02-23: 40 mg via ORAL
  Filled 2021-02-22: qty 2
  Filled 2021-02-22: qty 4

## 2021-02-22 MED ORDER — HYDROCHLOROTHIAZIDE 12.5 MG PO TABS
12.5000 mg | ORAL_TABLET | Freq: Every day | ORAL | Status: DC
  Administered 2021-02-23: 12.5 mg via ORAL
  Filled 2021-02-22: qty 1

## 2021-02-22 MED ORDER — LABETALOL HCL 5 MG/ML IV SOLN: 10.0000 mg | INTRAVENOUS | Status: DC | PRN

## 2021-02-22 MED ORDER — HEPARIN SODIUM (PORCINE) 1000 UNIT/ML IJ SOLN
INTRAMUSCULAR | Status: DC | PRN
  Administered 2021-02-22: 2000 [IU] via INTRAVENOUS
  Administered 2021-02-22: 11000 [IU] via INTRAVENOUS

## 2021-02-22 MED ORDER — PHENOL 1.4 % MT LIQD: 1.0000 | OROMUCOSAL | Status: DC | PRN

## 2021-02-22 MED ORDER — PROTAMINE SULFATE 10 MG/ML IV SOLN
INTRAVENOUS | Status: DC | PRN
  Administered 2021-02-22: 50 mg via INTRAVENOUS

## 2021-02-22 MED ORDER — LACTATED RINGERS IV SOLN: INTRAVENOUS | Status: DC

## 2021-02-22 MED ORDER — MIDAZOLAM HCL 2 MG/2ML IJ SOLN: 0.5000 mg | Freq: Once | INTRAMUSCULAR | Status: DC | PRN

## 2021-02-22 MED ORDER — DOCUSATE SODIUM 100 MG PO CAPS
100.0000 mg | ORAL_CAPSULE | Freq: Every day | ORAL | Status: DC
  Administered 2021-02-23: 100 mg via ORAL
  Filled 2021-02-22: qty 1

## 2021-02-22 MED ORDER — ASPIRIN EC 81 MG PO TBEC
81.0000 mg | DELAYED_RELEASE_TABLET | Freq: Every day | ORAL | Status: DC
  Administered 2021-02-23: 81 mg via ORAL
  Filled 2021-02-22: qty 1

## 2021-02-22 MED ORDER — ONDANSETRON HCL 4 MG/2ML IJ SOLN
4.0000 mg | Freq: Four times a day (QID) | INTRAMUSCULAR | Status: DC | PRN
Start: 2021-02-22 — End: 2021-02-23

## 2021-02-22 MED ORDER — LIDOCAINE 2% (20 MG/ML) 5 ML SYRINGE
INTRAMUSCULAR | Status: AC
  Filled 2021-02-22: qty 5

## 2021-02-22 SURGICAL SUPPLY — 53 items
BAG COUNTER SPONGE SURGICOUNT (BAG) ×2 IMPLANT
CANISTER SUCT 3000ML PPV (MISCELLANEOUS) ×2 IMPLANT
CATH ROBINSON RED A/P 18FR (CATHETERS) ×2 IMPLANT
CLIP VESOCCLUDE MED 24/CT (CLIP) ×2 IMPLANT
CLIP VESOCCLUDE SM WIDE 24/CT (CLIP) ×2 IMPLANT
COVER PROBE W GEL 5X96 (DRAPES) ×2 IMPLANT
COVER TRANSDUCER ULTRASND GEL (DISPOSABLE) ×2 IMPLANT
DERMABOND ADVANCED (GAUZE/BANDAGES/DRESSINGS) ×1
DERMABOND ADVANCED .7 DNX12 (GAUZE/BANDAGES/DRESSINGS) ×1 IMPLANT
DRAIN CHANNEL 15F RND FF W/TCR (WOUND CARE) IMPLANT
ELECT REM PT RETURN 9FT ADLT (ELECTROSURGICAL) ×2
ELECTRODE REM PT RTRN 9FT ADLT (ELECTROSURGICAL) ×1 IMPLANT
EVACUATOR SILICONE 100CC (DRAIN) IMPLANT
GLOVE SRG 8 PF TXTR STRL LF DI (GLOVE) ×1 IMPLANT
GLOVE SURG ENC MOIS LTX SZ7.5 (GLOVE) ×2 IMPLANT
GLOVE SURG MICRO LTX SZ6.5 (GLOVE) ×2 IMPLANT
GLOVE SURG UNDER POLY LF SZ6.5 (GLOVE) ×10 IMPLANT
GLOVE SURG UNDER POLY LF SZ8 (GLOVE) ×1
GOWN STRL REUS W/ TWL LRG LVL3 (GOWN DISPOSABLE) ×2 IMPLANT
GOWN STRL REUS W/ TWL XL LVL3 (GOWN DISPOSABLE) ×1 IMPLANT
GOWN STRL REUS W/TWL LRG LVL3 (GOWN DISPOSABLE) ×2
GOWN STRL REUS W/TWL XL LVL3 (GOWN DISPOSABLE) ×1
GRAFT VASC PATCH XENOSURE 1X14 (Vascular Products) ×2 IMPLANT
HEMOSTAT SNOW SURGICEL 2X4 (HEMOSTASIS) ×2 IMPLANT
IV ADAPTER SYR DOUBLE MALE LL (MISCELLANEOUS) IMPLANT
KIT BASIN OR (CUSTOM PROCEDURE TRAY) ×2 IMPLANT
KIT SHUNT ARGYLE CAROTID ART 6 (VASCULAR PRODUCTS) ×2 IMPLANT
KIT TURNOVER KIT B (KITS) ×2 IMPLANT
LOOP VESSEL MINI RED (MISCELLANEOUS) IMPLANT
NEEDLE HYPO 25GX1X1/2 BEV (NEEDLE) IMPLANT
NEEDLE SPNL 20GX3.5 QUINCKE YW (NEEDLE) IMPLANT
NS IRRIG 1000ML POUR BTL (IV SOLUTION) ×4 IMPLANT
PACK CAROTID (CUSTOM PROCEDURE TRAY) ×2 IMPLANT
PAD ARMBOARD 7.5X6 YLW CONV (MISCELLANEOUS) ×4 IMPLANT
PATCH VASC XENOSURE 1CMX6CM (Vascular Products) IMPLANT
PATCH VASC XENOSURE 1X6 (Vascular Products) IMPLANT
POSITIONER HEAD DONUT 9IN (MISCELLANEOUS) ×2 IMPLANT
SHUNT CAROTID BYPASS 10 (VASCULAR PRODUCTS) IMPLANT
SHUNT CAROTID BYPASS 12FRX15.5 (VASCULAR PRODUCTS) IMPLANT
SPONGE SURGIFOAM ABS GEL 100 (HEMOSTASIS) IMPLANT
STOPCOCK 4 WAY LG BORE MALE ST (IV SETS) IMPLANT
SUT ETHILON 3 0 PS 1 (SUTURE) IMPLANT
SUT MNCRL AB 4-0 PS2 18 (SUTURE) ×2 IMPLANT
SUT PROLENE 5 0 C 1 24 (SUTURE) ×4 IMPLANT
SUT PROLENE 6 0 BV (SUTURE) ×6 IMPLANT
SUT SILK 3 0 (SUTURE) ×1
SUT SILK 3-0 18XBRD TIE 12 (SUTURE) ×1 IMPLANT
SUT VIC AB 3-0 SH 27 (SUTURE) ×1
SUT VIC AB 3-0 SH 27X BRD (SUTURE) ×1 IMPLANT
SYR CONTROL 10ML LL (SYRINGE) IMPLANT
TOWEL GREEN STERILE (TOWEL DISPOSABLE) ×2 IMPLANT
TUBING ART PRESS 48 MALE/FEM (TUBING) IMPLANT
WATER STERILE IRR 1000ML POUR (IV SOLUTION) ×2 IMPLANT

## 2021-02-22 NOTE — Anesthesia Procedure Notes (Signed)
Arterial Line Insertion Start/End10/19/2022 9:10 AM Performed by: Drema Pry, CRNA, CRNA  Preanesthetic checklist: patient identified, IV checked and monitors and equipment checked Lidocaine 1% used for infiltration Left, radial was placed Catheter size: 20 G Hand hygiene performed  and maximum sterile barriers used   Attempts: 2 Procedure performed using ultrasound guided technique. Following insertion, dressing applied and Biopatch. Post procedure assessment: normal  Patient tolerated the procedure well with no immediate complications.

## 2021-02-22 NOTE — Op Note (Signed)
OPERATIVE NOTE  PROCEDURE:   1.  right carotid endarterectomy with bovine patch angioplasty 2.  right intraoperative carotid ultrasound  PRE-OPERATIVE DIAGNOSIS: right asymptomatic high grade carotid stenosis >80%  POST-OPERATIVE DIAGNOSIS: same as above   SURGEON: [name], MD  ASSISTANT(S): Wendi Maya, PA  ANESTHESIA: general  ESTIMATED BLOOD LOSS: <50 mL  FINDING(S): High-grade calcified right internal carotid artery plaque that extended high about 6 to 8 cm distal to the bifurcation onto the mid ICA 2.  After endarterectomy and patch angioplasty, continuous doppler audible flow signatures are appropriate for each carotid artery. 3.  No evidence of intimal flap visualized on transverse or longitudinal ultrasonography.  SPECIMEN(S):  Carotid plaque   INDICATIONS:   Russell Butler is a 75 y.o. male who presents with right asymptomatic high grade carotid stenosis >80%.  I discussed with the patient the risks, benefits, and alternatives to carotid endarterectomy.  I discussed the procedural details of carotid endarterectomy with the patient.  The patient is aware that the risks of carotid endarterectomy include but are not limited to: bleeding, infection, stroke, myocardial infarction, death, cranial nerve injuries both temporary and permanent, neck hematoma, possible airway compromise, labile blood pressure post-operatively, cerebral hyperperfusion syndrome, and possible need for additional interventions in the future. The patient is aware of the risks and agrees to proceed forward with the procedure.  An assistant was needed for exposure and to expedite the case.   DESCRIPTION: After full informed written consent was obtained from the patient, the patient was brought back to the operating room and placed supine upon the operating table.  Prior to induction, the patient received IV antibiotics.  After obtaining adequate anesthesia, the patient was placed into  semi-Fowler position with a shoulder roll in place and the patient's neck slightly hyperextended and rotated away from the surgical site.  The patient was prepped in the standard fashion for a right carotid endarterectomy.  I made an incision anterior to the sternocleidomastoid muscle and dissected down through the subcutaneous tissue.  The platysma was opened with electrocautery.  I then used Bovie cautery and blunt dissection to dissect through the underlying platysma and to mobilize the anterior border of the sternocleidomastoid as well as the internal jugular vein laterally.  The facial vein was ligated with 3-0 silk and surgical clips and divided.  After identifying the carotid artery I used Metzenbaum scissors to bluntly dissect the common carotid artery and then controlled this with a umbilical tape.  At this point in time the patient was given 100 units/kg of IV heparin and we checked an ACT to ensure it was greater than 250.  I then carried my dissection cephalad and mobilized the external carotid artery and superior thyroid artery and controlled each of these with a vessel loop.  I then dissected out the internal carotid artery well past the distal plaque.  The internal carotid artery was then controlled with a umbilical tape as well. I was careful to identify the vagus nerve between the internal jugular and common carotid and this was presereved.  I was also careful to identify and preserve the hypoglossal nerve and this was preserved.    Once our ACT was confirmed, I proceeded by clamping the internal carotid artery with a angled bulldog clamp first.  The proximal common carotid artery was controlled with a angled debakey clamp.  The external carotid was controlled with a vessel loop.  I subsequently opened the common carotid artery with an 11 blade scalpel  in longitudinal fashion and extended the arteriotomy with Potts scissors onto the ICA past the distal plaque.  I had to open a long arteriotomy onto  the ICA to get past the plaque.  I elected not to place a shunt given he had extensive internal carotid artery disease that extended a good 6 to 8 cm past the bifurcation.   I then used a Runner, broadcasting/film/video and performed a endarterectomy starting in the common carotid artery.  The external carotid artery was endarterectomized with an eversion technique and I was careful to feather the distal ICA plaque.  The specimen was passed off the field.  The endarterectomy site was then flushed with heparinized saline and I was careful to ensure there were no flaps in the endarterectomy site.  I then brought a bovine carotid patch on the field and this was sewn in place with a running anastomosis using a 6-0 Prolene distally and a 5-0 proximal.  The bovine patch was trimmed accordingly.  The artery was flushed antegrade and retrograde prior to completion of the patch.  Once the patch was complete, I flushed up the external carotid artery first prior to releasing the internal carotid artery clamp.  An intraoperative duplex was performed that showed no evidence of any flaps.  A doppler confirmed excellent doppler flow in the ICA and ECA.  Once I was happy with the intraoperative ultrasound the patient was given protamine for reversal.  I used surgicel snow to get hemostasis around the patch.  Ultimately the platysma was closed in running fashion with 3-0 Vicryl.  The skin was closed with a running 4-0 Monocryl.  Dermabond was applied with a dry sterile dressing.  The patient was awakened from anesthesia with no new neurological deficit and taken to PACU in stable condition.    COMPLICATIONS: None  CONDITION: Stable  [name], MD [facility] Office: [phone]  [name]   02/22/2021, 1:03 PM

## 2021-02-22 NOTE — Plan of Care (Signed)
Pt arrived on the unit. A/ox4, Neurologically intact, VS stable, Huston Foley but asymptomatic. Incision is clean, dry. Aline zeroed in, patent. Belongings at the bedside, bed in low position, alarms are on, call bell in reach. Family at the bedside

## 2021-02-22 NOTE — Anesthesia Procedure Notes (Signed)
Procedure Name: Intubation Date/Time: 02/22/2021 11:06 AM Performed by: Erick Colace, CRNA Pre-anesthesia Checklist: Patient identified, Emergency Drugs available, Suction available and Patient being monitored Patient Re-evaluated:Patient Re-evaluated prior to induction Oxygen Delivery Method: Circle system utilized Preoxygenation: Pre-oxygenation with 100% oxygen Induction Type: IV induction Ventilation: Mask ventilation without difficulty Laryngoscope Size: Mac and 4 Grade View: Grade I Tube type: Oral Tube size: 7.5 mm Number of attempts: 1 Airway Equipment and Method: Stylet and Oral airway Placement Confirmation: ETT inserted through vocal cords under direct vision, positive ETCO2 and breath sounds checked- equal and bilateral Secured at: 22 cm Tube secured with: Tape Dental Injury: Teeth and Oropharynx as per pre-operative assessment

## 2021-02-22 NOTE — Transfer of Care (Signed)
Immediate Anesthesia Transfer of Care Note  Patient: Russell Butler  Procedure(s) Performed: RIGHT CAROTID ENDARTERECTOMY (Right: Neck) PATCH ANGIOPLASTY USING 1CM x 14CM XENOSURE BIOLOGIC PATCH (Right: Neck)  Patient Location: PACU  Anesthesia Type:General  Level of Consciousness: awake  Airway & Oxygen Therapy: Patient Spontanous Breathing and Patient connected to face mask oxygen  Post-op Assessment: Report given to RN and Post -op Vital signs reviewed and stable  Post vital signs: Reviewed and stable  Last Vitals:  Vitals Value Taken Time  BP 134/58 02/22/21 1326  Temp    Pulse 68 02/22/21 1337  Resp 13 02/22/21 1337  SpO2 100 % 02/22/21 1337  Vitals shown include unvalidated device data.  Last Pain:  Vitals:   02/22/21 0807  TempSrc:   PainSc: 0-No pain         Complications: No notable events documented.

## 2021-02-22 NOTE — Anesthesia Postprocedure Evaluation (Signed)
Anesthesia Post Note  Patient: Russell Butler  Procedure(s) Performed: RIGHT CAROTID ENDARTERECTOMY (Right: Neck) PATCH ANGIOPLASTY USING 1CM x 14CM XENOSURE BIOLOGIC PATCH (Right: Neck)     Patient location during evaluation: PACU Anesthesia Type: General Level of consciousness: awake and alert, patient cooperative and oriented Pain management: pain level controlled Vital Signs Assessment: post-procedure vital signs reviewed and stable Respiratory status: spontaneous breathing, nonlabored ventilation and respiratory function stable Cardiovascular status: blood pressure returned to baseline and stable Postop Assessment: no apparent nausea or vomiting Anesthetic complications: no   No notable events documented.  Last Vitals:  Vitals:   02/22/21 1411 02/22/21 1426  BP: (!) 116/56 117/60  Pulse: 67 65  Resp: 17 18  Temp:    SpO2: 92% 95%    Last Pain:  Vitals:   02/22/21 1326  TempSrc:   PainSc: 0-No pain                 [name],E. Eliz Nigg

## 2021-02-22 NOTE — H&P (Signed)
History and Physical Interval Note:  02/22/2021 10:12 AM  Russell Butler  has presented today for surgery, with the diagnosis of RIGHT CAROTID STENOSIS.  The various methods of treatment have been discussed with the patient and family. After consideration of risks, benefits and other options for treatment, the patient has consented to  Procedure(s): RIGHT CAROTID ENDARTERECTOMY (Right) as a surgical intervention.  The patient's history has been reviewed, patient examined, no change in status, stable for surgery.  I have reviewed the patient's chart and labs.  Questions were answered to the patient's satisfaction.    Plan right carotid endarterectomy for asymptomatic high-grade stenosis.  Risk and benefits again discussed including 1% stroke risk.  Russell Butler  Patient name: Russell Butler            MRN: [id number]        DOB: 1945-09-23          Sex: male   REASON FOR CONSULT: Evaluate carotid artery disease   HPI: Russell Butler is a 75 y.o. male, with history of hypertension, hyperlipidemia, coronary artery disease that presents for evaluation of carotid artery disease.  Patient denies any history of TIAs or strokes.  He was evaluated by Dr. Gilda Crease with [facility] and offered a right carotid endarterectomy.  He states he would prefer to have surgery here in [place] given this is closer to home.  His ultrasound carotid duplex on 09/22/2020 showed a greater than 80% right ICA stenosis and moderate 60 to 79% left ICA stenosis.  CTA neck on 11/30/2020 showed a greater than 80% right ICA stenosis and a 60% left carotid stenosis by CT.  He denies any history of neck surgery or neck radiation.  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       Past Medical History:  Diagnosis Date   Carotid bruit 12/28/2019   Chest pain of uncertain etiology 12/28/2019   Coronary artery disease 03/24/2020   Dyspnea     Essential hypertension     Hyperlipidemia     Hypertension      Mixed hyperlipidemia     Peripheral vascular disease, unspecified (HCC) up to 79% p both sides carotids 02/24/2020   Snoring     SOB (shortness of breath)     Tinnitus             Past Surgical History:  Procedure Laterality Date   INTRAVASCULAR PRESSURE WIRE/FFR STUDY N/A 01/05/2021    Procedure: INTRAVASCULAR PRESSURE WIRE/FFR STUDY;  Surgeon: Swaziland, Peter M, MD;  Location: [facility] INVASIVE CV LAB;  Service: Cardiovascular;  Laterality: N/A;   LEFT HEART CATH AND CORONARY ANGIOGRAPHY N/A 01/05/2021    Procedure: LEFT HEART CATH AND CORONARY ANGIOGRAPHY;  Surgeon: Swaziland, Peter M, MD;  Location: [facility] INVASIVE CV LAB;  Service: Cardiovascular;  Laterality: N/A;   NO PAST SURGERIES               Family History  Problem Relation Age of Onset   Depression Mother     Liver disease Father     Hypertension Brother     Hypercholesterolemia Brother     Diabetes Maternal Grandfather     Cancer Paternal Grandfather        SOCIAL HISTORY: Social History         Socioeconomic History   Marital status: Unknown      Spouse name: Not on file   Number of children: Not on file   Years of education: Not  on file   Highest education level: Not on file  Occupational History   Not on file  Tobacco Use   Smoking status: Former      Types: Cigarettes      Quit date: 12/28/1979      Years since quitting: 41.1   Smokeless tobacco: Never  Substance and Sexual Activity   Alcohol use: Yes      Alcohol/week: 4.0 standard drinks      Types: 4 Glasses of wine per week   Drug use: Never   Sexual activity: Not on file  Other Topics Concern   Not on file  Social History Narrative   Not on file    Social Determinants of Health    Financial Resource Strain: Not on file  Food Insecurity: Not on file  Transportation Needs: Not on file  Physical Activity: Not on file  Stress: Not on file  Social Connections: Not on file  Intimate Partner Violence: Not on file      No Known Allergies          Current Outpatient Medications  Medication Sig Dispense Refill   aspirin EC 81 MG tablet Take 81 mg by mouth daily. Swallow whole.       atorvastatin (LIPITOR) 80 MG tablet Take 1 tablet (80 mg total) by mouth daily. 90 tablet 3   BREO ELLIPTA 100-25 MCG/INH AEPB Inhale 2 puffs into the lungs daily.       hydrochlorothiazide (HYDRODIURIL) 25 MG tablet Take 0.5 tablets (12.5 mg total) by mouth daily. 45 tablet 1   lisinopril (ZESTRIL) 40 MG tablet Take 40 mg by mouth daily.       metoprolol succinate (TOPROL XL) 25 MG 24 hr tablet Take 1 tablet (25 mg total) by mouth daily. 30 tablet 11    No current facility-administered medications for this visit.      REVIEW OF SYSTEMS:  [X]  denotes positive finding, [ ]  denotes negative finding Cardiac   Comments:  Chest pain or chest pressure:      Shortness of breath upon exertion:      Short of breath when lying flat:      Irregular heart rhythm:             Vascular      Pain in calf, thigh, or hip brought on by ambulation:      Pain in feet at night that wakes you up from your sleep:       Blood clot in your veins:      Leg swelling:              Pulmonary      Oxygen at home:      Productive cough:       Wheezing:              Neurologic      Sudden weakness in arms or legs:       Sudden numbness in arms or legs:       Sudden onset of difficulty speaking or slurred speech:      Temporary loss of vision in one eye:       Problems with dizziness:              Gastrointestinal      Blood in stool:       Vomited blood:              Genitourinary      Burning when urinating:  Blood in urine:             Psychiatric      Major depression:              Hematologic      Bleeding problems:      Problems with blood clotting too easily:             Skin      Rashes or ulcers:             Constitutional      Fever or chills:          PHYSICAL EXAM:     Vitals:    02/07/21 1411 02/07/21 1413  BP: 126/75 136/73   Pulse: 74 75  Resp: 18    Temp: 97.6 F (36.4 C)    TempSrc: Temporal    SpO2: 96%    Weight: 244 lb (110.7 kg)    Height: 5\' 8"  (1.727 m)        GENERAL: The patient is a well-nourished male, in no acute distress. The vital signs are documented above. CARDIAC: There is a regular rate and rhythm.  VASCULAR:  Palpable radial pulses both upper extremities PULMONARY: No respiratory distress. ABDOMEN: Soft and non-tender. MUSCULOSKELETAL: There are no major deformities or cyanosis. NEUROLOGIC: No focal weakness or paresthesias are detected.  Cranial nerves II through XII grossly intact. SKIN: There are no ulcers or rashes noted. PSYCHIATRIC: The patient has a normal affect.   DATA:    CTA neck reviewed 11/30/2020 and greater than 80% right ICA stenosis by my measurement with a retropharyngeal carotid.   Assessment/Plan:   75 year old male presents with a greater than 80% asymptomatic right internal carotid stenosis.  I agree with the recommendation from Dr. 61 that patient would benefit from a right carotid endarterectomy given this is a fairly calcified lesion and a fairly low bifurcation.  We discussed this for stroke risk reduction.  Discussed guidelines are for surgery for greater than 80% stenosis when asymptomatic.  I have offered to do this at [facility].  He has recently undergone cardiac cath and he was not a candidate for PCI and medical therapy has been recommended.  He denies any chest pain at this time.  He does have some chronic shortness of breath at baseline only with exertion.  Risk and benefits of carotid surgery were discussed with him including 1% risk of perioperative stroke and risk of anesthesia including MI etc.  We will get him scheduled for 10/19.     11/19, MD [facility] Office: [phone]

## 2021-02-23 ENCOUNTER — Encounter (HOSPITAL_COMMUNITY): Payer: Self-pay | Admitting: Vascular Surgery

## 2021-02-23 DIAGNOSIS — Z9889 Other specified postprocedural states: Secondary | ICD-10-CM

## 2021-02-23 LAB — CBC
HCT: 36.6 % — ABNORMAL LOW (ref 39.0–52.0)
Hemoglobin: 11.9 g/dL — ABNORMAL LOW (ref 13.0–17.0)
MCH: 30.8 pg (ref 26.0–34.0)
MCHC: 32.5 g/dL (ref 30.0–36.0)
MCV: 94.8 fL (ref 80.0–100.0)
Platelets: 151 10*3/uL (ref 150–400)
RBC: 3.86 MIL/uL — ABNORMAL LOW (ref 4.22–5.81)
RDW: 13.1 % (ref 11.5–15.5)
WBC: 11.9 10*3/uL — ABNORMAL HIGH (ref 4.0–10.5)
nRBC: 0 % (ref 0.0–0.2)

## 2021-02-23 LAB — LIPID PANEL
Cholesterol: 108 mg/dL (ref 0–200)
HDL: 49 mg/dL (ref >40)
LDL Cholesterol: 48 mg/dL (ref 0–99)
Total CHOL/HDL Ratio: 2.2 RATIO
Triglycerides: 54 mg/dL (ref <150)
VLDL: 11 mg/dL (ref 0–40)

## 2021-02-23 LAB — BASIC METABOLIC PANEL
Anion gap: 10 (ref 5–15)
BUN: 21 mg/dL (ref 8–23)
CO2: 21 mmol/L — ABNORMAL LOW (ref 22–32)
Calcium: 8.5 mg/dL — ABNORMAL LOW (ref 8.9–10.3)
Chloride: 103 mmol/L (ref 98–111)
Creatinine, Ser: 1.18 mg/dL (ref 0.61–1.24)
GFR, Estimated: >60 mL/min (ref >60)
Glucose, Bld: 150 mg/dL — ABNORMAL HIGH (ref 70–99)
Potassium: 4.5 mmol/L (ref 3.5–5.1)
Sodium: 134 mmol/L — ABNORMAL LOW (ref 135–145)

## 2021-02-23 LAB — POCT ACTIVATED CLOTTING TIME
Activated Clotting Time: 248 seconds
Activated Clotting Time: 277 seconds

## 2021-02-23 MED ORDER — OXYCODONE-ACETAMINOPHEN 5-325 MG PO TABS: 1.0000 | ORAL_TABLET | Freq: Four times a day (QID) | ORAL | 0 refills | Status: AC | PRN

## 2021-02-23 NOTE — Progress Notes (Signed)
Pt being d/c, VSS, IV removed, Education complete, Returned telebox.    Kalman Jewels, RN 02/23/2021 11:11 AM

## 2021-02-23 NOTE — Discharge Summary (Signed)
Carotid Discharge Summary     Russell Butler 05-31-1945 75 y.o. male  [id number]  Admission Date: 02/22/2021  Discharge Date: 02/23/2021  Physician: [name], MD  Admission Diagnosis: Carotid stenosis, right [facility]  Hospital Course:  The patient was admitted to the hospital and taken to the operating room on 02/22/2021 and underwent right carotid endarterectomy.  The pt tolerated the procedure well and was transported to the PACU in good condition.   By POD 1, the pt neuro status remained intact. Right neck incision well appearing without swelling or hematoma. Hemodynamically stable.  The remainder of the hospital course consisted of increasing mobilization and increasing intake of solids without difficulty.  He remained stable for discharge home. He will continue Aspirin and Statin. PRMP was reviewed and He has follow up arranged in office in 2-3 weeks. He has left ICA stenosis that we will continue to follow as an outpatient.    Recent Labs    02/23/21 0211  NA 134*  K 4.5  CL 103  CO2 21*  GLUCOSE 150*  BUN 21  CALCIUM 8.5*   Recent Labs    02/22/21 1826 02/23/21 0211  WBC 10.9* 11.9*  HGB 12.8* 11.9*  HCT 38.7* 36.6*  PLT 168 151   No results for input(s): INR in the last 72 hours.   Discharge Instructions     Call MD for:  difficulty breathing, headache or visual disturbances   Complete by: As directed    Call MD for:  redness, tenderness, or signs of infection (pain, swelling, redness, odor or green/yellow discharge around incision site)   Complete by: As directed    Call MD for:  severe uncontrolled pain   Complete by: As directed    Call MD for:  temperature >100.4   Complete by: As directed    Diet - low sodium heart healthy   Complete by: As directed    Discharge patient   Complete by: As directed    Discharge disposition: 01-Home or Self Care   Discharge patient date: 02/23/2021   Discharge wound care:   Complete by: As  directed    Clean incision with mild soap and water, pat dry   Driving Restrictions   Complete by: As directed    No driving for 3 weeks. No driving while taking pain medication   Increase activity slowly   Complete by: As directed    Lifting restrictions   Complete by: As directed    No heavy lifting, pushing, pulling > 10 lbs       Discharge Diagnosis:  Carotid stenosis, right [I65.21]  Secondary Diagnosis: Patient Active Problem List   Diagnosis Date Noted   Carotid stenosis, right 02/22/2021   Preop cardiovascular exam 01/02/2021   Carotid stenosis 11/14/2020   Coronary artery disease 03/24/2020   Peripheral vascular disease, unspecified (HCC) up to 79% p both sides carotids 02/24/2020   Chest pain of uncertain etiology 12/28/2019   Dyspnea    Essential hypertension    Hypertension    Mixed hyperlipidemia    Snoring    SOB (shortness of breath)    Tinnitus    Past Medical History:  Diagnosis Date   Asthma    Carotid bruit 12/28/2019   Chest pain of uncertain etiology 12/28/2019   Coronary artery disease 03/24/2020   Dyspnea    Essential hypertension    GERD (gastroesophageal reflux disease)    Heart murmur    Hyperlipidemia    Hypertension    Mixed  hyperlipidemia    Peripheral vascular disease, unspecified (HCC) up to 79% p both sides carotids 02/24/2020   Snoring    SOB (shortness of breath)    Tinnitus     Allergies as of 02/23/2021   No Known Allergies      Medication List     TAKE these medications    aspirin EC 81 MG tablet Take 81 mg by mouth daily. Swallow whole.   atorvastatin 80 MG tablet Commonly known as: LIPITOR Take 1 tablet (80 mg total) by mouth daily.   Breo Ellipta 100-25 MCG/ACT Aepb Generic drug: fluticasone furoate-vilanterol Inhale 1 puff into the lungs in the morning and at bedtime.   hydrochlorothiazide 25 MG tablet Commonly known as: HYDRODIURIL Take 0.5 tablets (12.5 mg total) by mouth daily.   lisinopril 40  MG tablet Commonly known as: ZESTRIL Take 40 mg by mouth daily.   metoprolol succinate 25 MG 24 hr tablet Commonly known as: Toprol XL Take 1 tablet (25 mg total) by mouth daily. What changed: how much to take   oxyCODONE-acetaminophen 5-325 MG tablet Commonly known as: Percocet Take 1 tablet by mouth every 6 (six) hours as needed for up to 3 days for severe pain.               Discharge Care Instructions  (From admission, onward)           Start     Ordered   02/23/21 0000  Discharge wound care:       Comments: Clean incision with mild soap and water, pat dry   02/23/21 0758             Discharge Instructions:   [facility] Discharge Instructions Carotid Endarterectomy (CEA)  Please refer to the following instructions for your post-procedure care. Your surgeon or physician assistant will discuss any changes with you.  Activity  You are encouraged to walk as much as you can. You can slowly return to normal activities but must avoid strenuous activity and heavy lifting until your doctor tell you it's OK. Avoid activities such as vacuuming or swinging a golf club. You can drive after one week if you are comfortable and you are no longer taking prescription pain medications. It is normal to feel tired for serval weeks after your surgery. It is also normal to have difficulty with sleep habits, eating, and bowel movements after surgery. These will go away with time.  Bathing/Showering  You may shower after you come home. Do not soak in a bathtub, hot tub, or swim until the incision heals completely.  Incision Care  Shower every day. Clean your incision with mild soap and water. Pat the area dry with a clean towel. You do not need a bandage unless otherwise instructed. Do not apply any ointments or creams to your incision. You may have skin glue on your incision. Do not peel it off. It will come off on its own in about one week. Your  incision may feel thickened and raised for several weeks after your surgery. This is normal and the skin will soften over time. For Men Only: It's OK to shave around the incision but do not shave the incision itself for 2 weeks. It is common to have numbness under your chin that could last for several months.  Diet  Resume your normal diet. There are no special food restrictions following this procedure. A low fat/low cholesterol diet is recommended for all patients with vascular  disease. In order to heal from your surgery, it is CRITICAL to get adequate nutrition. Your body requires vitamins, minerals, and protein. Vegetables are the best source of vitamins and minerals. Vegetables also provide the perfect balance of protein. Processed food has little nutritional value, so try to avoid this.  Medications  Resume taking all of your medications unless your doctor or physician assistant tells you not to.  If your incision is causing pain, you may take over-the- counter pain relievers such as acetaminophen (Tylenol). If you were prescribed a stronger pain medication, please be aware these medications can cause nausea and constipation.  Prevent nausea by taking the medication with a snack or meal. Avoid constipation by drinking plenty of fluids and eating foods with a high amount of fiber, such as fruits, vegetables, and grains. Do not take Tylenol if you are taking prescription pain medications.  Follow Up  Our office will schedule a follow up appointment 2-3 weeks following discharge.  Please call us immediately for any of the following conditions  Increased pain, redness, drainage (pus) from your incision site. Fever of 101 degrees or higher. If you should develop stroke (slurred speech, difficulty swallowing, weakness on one side of your body, loss of vision) you should call 911 and go to the nearest emergency room.  Reduce your risk of vascular disease:  Stop smoking. If you would like help  call [facility] at 1-800-QUIT-NOW (7476035389) or [place] at 820-217-9244. Manage your cholesterol Maintain a desired weight Control your diabetes Keep your blood pressure down  If you have any questions, please call the office at [phone].  Prescriptions given: Oxycodone-Acetaminophen 5-325mg  #12 No Refill  Disposition: Home  Patient's condition: is Good  Follow up: 1. Dr. Chestine Spore in 2 weeks.   Damien Batty PA-C [facility] [phone]   --- For [facility] use ---   Modified Rankin score at D/C (0-6): 0  IV medication needed for:  1. Hypertension: No 2. Hypotension: No  Post-op Complications: No  1. Post-op CVA or TIA: No  If yes: Event classification (right eye, left eye, right cortical, left cortical, verterobasilar, other): n/a  If yes: Timing of event (intra-op, <6 hrs post-op, >=6 hrs post-op, unknown): n/a  2. CN injury: No  If yes: CN not injuried   3. Myocardial infarction: No  If yes: Dx by (EKG or clinical, Troponin): n/a  4.  CHF: No  5.  Dysrhythmia (new): No  6. Wound infection: No  7. Reperfusion symptoms: No  8. Return to OR: No  If yes: return to OR for (bleeding, neurologic, other CEA incision, other): n/a  Discharge medications: Statin use:  Yes ASA use:  Yes   Beta blocker use:  Yes ACE-Inhibitor use:  Yes  ARB use:  No CCB use: No P2Y12 Antagonist use: No, [ ]  Plavix, [ ]  Plasugrel, [ ]  Ticlopinine, [ ]  Ticagrelor, [ ]  Other, [ ]  No for medical reason, [ ]  Non-compliant, [ ]  Not-indicated Anti-coagulant use:  No, [ ]  Warfarin, [ ]  Rivaroxaban, [ ]  Dabigatran,

## 2021-02-23 NOTE — Discharge Instructions (Signed)
   [facility] [place]  Discharge Instructions   Carotid Surgery  Please refer to the following instructions for your post-procedure care. Your surgeon or physician assistant will discuss any changes with you.  Activity  You are encouraged to walk as much as you can. You can slowly return to normal activities but must avoid strenuous activity and heavy lifting until your doctor tell you it's okay. Avoid activities such as vacuuming or swinging a golf club. You can drive after one week if you are comfortable and you are no longer taking prescription pain medications. It is normal to feel tired for serval weeks after your surgery. It is also normal to have difficulty with sleep habits, eating, and bowel movements after surgery. These will go away with time.  Bathing/Showering  Shower daily after you go home. Do not soak in a bathtub, hot tub, or swim until the incision heals completely.  Incision Care  Shower every day. Clean your incision with mild soap and water. Pat the area dry with a clean towel. You do not need a bandage unless otherwise instructed. Do not apply any ointments or creams to your incision. You may have skin glue on your incision. Do not peel it off. It will come off on its own in about one week. Your incision may feel thickened and raised for several weeks after your surgery. This is normal and the skin will soften over time.   For Men Only: It's okay to shave around the incision but do not shave the incision itself for 2 weeks. It is common to have numbness under your chin that could last for several months.  Diet  Resume your normal diet. There are no special food restrictions following this procedure. A low fat/low cholesterol diet is recommended for all patients with vascular disease. In order to heal from your surgery, it is CRITICAL to get adequate nutrition. Your body requires vitamins, minerals, and protein. Vegetables are the best source of  vitamins and minerals. Vegetables also provide the perfect balance of protein. Processed food has little nutritional value, so try to avoid this.  Medications  Resume taking all of your medications unless your doctor or physician assistant tells you not to. If your incision is causing pain, you may take over-the- counter pain relievers such as acetaminophen (Tylenol). If you were prescribed a stronger pain medication, please be aware these medications can cause nausea and constipation. Prevent nausea by taking the medication with a snack or meal. Avoid constipation by drinking plenty of fluids and eating foods with a high amount of fiber, such as fruits, vegetables, and grains.   Do not take Tylenol if you are taking prescription pain medications.  Follow Up  Our office will schedule a follow up appointment 2-3 weeks following discharge.  Please call us immediately for any of the following conditions  . Increased pain, redness, drainage (pus) from your incision site. . Fever of 101 degrees or higher. . If you should develop stroke (slurred speech, difficulty swallowing, weakness on one side of your body, loss of vision) you should call 911 and go to the nearest emergency room. .  Reduce your risk of vascular disease:  . Stop smoking. If you would like help call [facility] at 1-800-QUIT-NOW (1-800-784-8669) or [place] at 336-586-4000. . Manage your cholesterol . Maintain a desired weight . Control your diabetes . Keep your blood pressure down .  If you have any questions, please call the office at [phone]. 

## 2021-02-23 NOTE — Progress Notes (Signed)
PHARMACIST LIPID MONITORING   Russell Butler is a 75 y.o. male admitted on 02/22/2021 for right carotid endarectomy.  Pharmacy has been consulted to optimize lipid-lowering therapy with the indication of secondary prevention for clinical ASCVD.  Recent Labs:  Lipid Panel (last 6 months):   Lab Results  Component Value Date   CHOL 108 02/23/2021   TRIG 54 02/23/2021   HDL 49 02/23/2021   CHOLHDL 2.2 02/23/2021   VLDL 11 02/23/2021   LDLCALC 48 02/23/2021    Hepatic function panel (last 6 months):   Lab Results  Component Value Date   AST 28 02/14/2021   ALT 24 02/14/2021   ALKPHOS 45 02/14/2021   BILITOT 0.6 02/14/2021    SCr (since admission):   Serum creatinine: 1.18 mg/dL 13/24/40 1027 Estimated creatinine clearance: 64.9 mL/min  Current therapy and lipid therapy tolerance Current lipid-lowering therapy: atorvastatin 80mg  Previous lipid-lowering therapies (if applicable): n/a Documented or reported allergies or intolerances to lipid-lowering therapies (if applicable): n/a  Assessment:   Patient prefers no changes in lipid-lowering therapy at this time due to lipids within goal on high intensity treatment.   Plan:    1.Statin intensity (high intensity recommended for all patients regardless of the LDL):  No statin changes. The patient is already on a high intensity statin.  2.Add ezetimibe (if any one of the following):   Not indicated at this time.  3.Refer to lipid clinic:   No  4.Follow-up with:  Primary care provider - , Sarah T, MD  5.Follow-up labs after discharge:  No changes in lipid therapy, repeat a lipid panel in one year.     [place] PharmD., BCPS Clinical Pharmacist 02/23/2021 8:09 AM

## 2021-02-23 NOTE — Progress Notes (Addendum)
  Progress Note    02/23/2021 7:49 AM 1 Day Post-Op  Subjective:  no complaints. Minimal incisional discomfort. Tolerating diet. Has ambulated in hall without difficulty   Vitals:   02/23/21 0324 02/23/21 0739  BP: (!) 95/50   Pulse: 60   Resp: 15   Temp: 98.5 F (36.9 C)   SpO2: 100% 98%   Physical Exam: Cardiac:  regular Lungs:  non labored Incisions:  right neck incision is clean, dry and intact. Very minimal swelling and ecchymosis. No hematoma Extremities:  moving all extremities without deficit. 5/5 grip strength bilaterally Neurologic: CN intact. Speech coherent. Tongue midline, smile symmetric. Sensation intact and equal bilaterally  CBC    Component Value Date/Time   WBC 11.9 (H) 02/23/2021 0211   RBC 3.86 (L) 02/23/2021 0211   HGB 11.9 (L) 02/23/2021 0211   HGB 13.4 01/02/2021 1520   HCT 36.6 (L) 02/23/2021 0211   HCT 40.8 01/02/2021 1520   PLT 151 02/23/2021 0211   PLT 200 01/02/2021 1520   MCV 94.8 02/23/2021 0211   MCV 93 01/02/2021 1520   MCH 30.8 02/23/2021 0211   MCHC 32.5 02/23/2021 0211   RDW 13.1 02/23/2021 0211   RDW 12.8 01/02/2021 1520    BMET    Component Value Date/Time   NA 134 (L) 02/23/2021 0211   NA 137 01/02/2021 1520   K 4.5 02/23/2021 0211   CL 103 02/23/2021 0211   CO2 21 (L) 02/23/2021 0211   GLUCOSE 150 (H) 02/23/2021 0211   BUN 21 02/23/2021 0211   BUN 26 01/02/2021 1520   CREATININE 1.18 02/23/2021 0211   CALCIUM 8.5 (L) 02/23/2021 0211   GFRNONAA >60 02/23/2021 0211   GFRAA 59 (L) 03/08/2020 1338    INR    Component Value Date/Time   INR 0.9 02/14/2021 1446     Intake/Output Summary (Last 24 hours) at 02/23/2021 0749 Last data filed at 02/23/2021 0443 Gross per 24 hour  Intake 1826.6 ml  Output 200 ml  Net 1626.6 ml     Assessment/Plan:  75 y.o. male is s/p right carotid endarterectomy 1 Day Post-Op   Patient doing well post op Incision is intact and without swelling or hematoma Neurologically  intact Hemodynamically stable Will go home on Aspirin and Statin He is okay for discharge home today He will have follow up in 2 weeks in our office  Russell Butler [facility] [phone] 02/23/2021 7:49 AM  I have seen and evaluated the patient. I agree with the PA note as documented above.  Postop day 1 status post right carotid endarterectomy for an asymptomatic high-grade stenosis.  Neck incision looks good.  He is neuro intact.  He has voided and ambulated in the hall.  Plan discharge today on aspirin and statin.  We will arrange follow-up in 2 to 3 weeks in the office.  Will need continued surveillance of his left moderate ICA stenosis.  Russell Shelling, MD [facility] [place] Office: [phone]

## 2021-03-01 ENCOUNTER — Other Ambulatory Visit: Payer: Self-pay | Admitting: Cardiology

## 2021-03-07 ENCOUNTER — Encounter: Payer: Medicare PPO | Admitting: Vascular Surgery

## 2021-03-14 ENCOUNTER — Ambulatory Visit (INDEPENDENT_AMBULATORY_CARE_PROVIDER_SITE_OTHER): Payer: Medicare PPO | Admitting: Vascular Surgery

## 2021-03-14 ENCOUNTER — Encounter: Payer: Self-pay | Admitting: Vascular Surgery

## 2021-03-14 ENCOUNTER — Other Ambulatory Visit: Payer: Self-pay

## 2021-03-14 VITALS — BP 122/72 | HR 64 | Temp 97.6°F | Resp 16 | Ht 68.0 in | Wt 242.0 lb

## 2021-03-14 DIAGNOSIS — I6523 Occlusion and stenosis of bilateral carotid arteries: Secondary | ICD-10-CM

## 2021-03-14 NOTE — Progress Notes (Signed)
Patient name: Russell Butler MRN: [id number] DOB: 1946-04-21 Sex: male  REASON FOR CONSULT: Post-op check after right carotid endarterectomy  HPI: Russell Butler is a 75 y.o. male, with history of hypertension, hyperlipidemia, coronary artery disease that presents for postop check after right carotid endarterectomy.  He had a right carotid endarterectomy on 02/22/2021 for an asymptomatic high-grade stenosis.  His hospital course was uncomplicated.  He has done well at home with no concerns today.  His previous ultrasound carotid duplex on 09/22/2020 showed a greater than 80% right ICA stenosis and moderate 60 to 79% left ICA stenosis.  CTA neck on 11/30/2020 showed a greater than 80% right ICA stenosis and a 60% left carotid stenosis by CT.    Past Medical History:  Diagnosis Date   Asthma    Carotid bruit 12/28/2019   Chest pain of uncertain etiology 12/28/2019   Coronary artery disease 03/24/2020   Dyspnea    Essential hypertension    GERD (gastroesophageal reflux disease)    Heart murmur    Hyperlipidemia    Hypertension    Mixed hyperlipidemia    Peripheral vascular disease, unspecified (HCC) up to 79% p both sides carotids 02/24/2020   Snoring    SOB (shortness of breath)    Tinnitus     Past Surgical History:  Procedure Laterality Date   CARDIAC CATHETERIZATION     ENDARTERECTOMY Right 02/22/2021   Procedure: RIGHT CAROTID ENDARTERECTOMY;  Surgeon: [name], MD;  Location: [facility] OR;  Service: Vascular;  Laterality: Right;   INTRAVASCULAR PRESSURE WIRE/FFR STUDY N/A 01/05/2021   Procedure: INTRAVASCULAR PRESSURE WIRE/FFR STUDY;  Surgeon: Swazil[place], Peter M, MD;  Location: [facility] INVASIVE CV LAB;  Service: Cardiovascular;  Laterality: N/A;   LEFT HEART CATH AND CORONARY ANGIOGRAPHY N/A 01/05/2021   Procedure: LEFT HEART CATH AND CORONARY ANGIOGRAPHY;  Surgeon: [place], Peter M, MD;  Location[facility] INVASIVE CV LAB;  Service: Cardiovascular;  Laterality: N/A;   NO PAST  SURGERIES     PATCH ANGIOPLASTY Right 02/22/2021   Procedure: PATCH ANGIOPLASTY USING 1CM x 14CM XENOSURE BIOLOGIC PATCH;  Surgeon: [name], MD;  Location: [facility] OR;  Service: Vascular;  Laterality: Right;    Family History  Problem Relation Age of Onset   Depression Mother    Liver disease Father    Hypertension Brother    Hypercholesterolemia Brother    Diabetes Maternal Grandfather    Cancer Paternal Grandfather     SOCIAL HISTORY: Social History   Socioeconomic History   Marital status: Single    Spouse name: Not on file   Number of children: Not on file   Years of education: Not on file   Highest education level: Not on file  Occupational History   Not on file  Tobacco Use   Smoking status: Former    Types: Cigarettes    Quit date: 12/28/1979    Years since quitting: 41.2   Smokeless tobacco: Never  Substance and Sexual Activity   Alcohol use: Yes    Alcohol/week: 10.0 standard drinks    Types: 10 Glasses of wine per week   Drug use: Never   Sexual activity: Not on file  Other Topics Concern   Not on file  Social History Narrative   Not on file   Social Determinants of Health   Financial Resource Strain: Not on file  Food Insecurity: Not on file  Transportation Needs: Not on file  Physical Activity: Not on file  Stress: Not on file  Social Connections: Not on file  Intimate Partner Violence: Not on file    No Known Allergies  Current Outpatient Medications  Medication Sig Dispense Refill   aspirin EC 81 MG tablet Take 81 mg by mouth daily. Swallow whole.     atorvastatin (LIPITOR) 80 MG tablet TAKE 1 TABLET BY MOUTH EVERY DAY 90 tablet 3   BREO ELLIPTA 100-25 MCG/INH AEPB Inhale 1 puff into the lungs in the morning and at bedtime.     hydrochlorothiazide (HYDRODIURIL) 25 MG tablet Take 0.5 tablets (12.5 mg total) by mouth daily. 45 tablet 1   lisinopril (ZESTRIL) 40 MG tablet Take 40 mg by mouth daily.     metoprolol succinate (TOPROL XL)  25 MG 24 hr tablet Take 1 tablet (25 mg total) by mouth daily. (Patient taking differently: Take 12.5 mg by mouth daily.) 30 tablet 11   No current facility-administered medications for this visit.    REVIEW OF SYSTEMS:  [X]  denotes positive finding, [ ]  denotes negative finding Cardiac  Comments:  Chest pain or chest pressure:    Shortness of breath upon exertion:    Short of breath when lying flat:    Irregular heart rhythm:        Vascular    Pain in calf, thigh, or hip brought on by ambulation:    Pain in feet at night that wakes you up from your sleep:     Blood clot in your veins:    Leg swelling:         Pulmonary    Oxygen at home:    Productive cough:     Wheezing:         Neurologic    Sudden weakness in arms or legs:     Sudden numbness in arms or legs:     Sudden onset of difficulty speaking or slurred speech:    Temporary loss of vision in one eye:     Problems with dizziness:         Gastrointestinal    Blood in stool:     Vomited blood:         Genitourinary    Burning when urinating:     Blood in urine:        Psychiatric    Major depression:         Hematologic    Bleeding problems:    Problems with blood clotting too easily:        Skin    Rashes or ulcers:        Constitutional    Fever or chills:      PHYSICAL EXAM: Vitals:   03/14/21 1339 03/14/21 1341  BP: 124/76 122/72  Pulse: 64 64  Resp: 16   Temp: 97.6 F (36.4 C)   TempSrc: Temporal   SpO2: 97%   Weight: 242 lb (109.8 kg)   Height: 5\' 8"  (1.727 m)     GENERAL: The patient is a well-nourished male, in no acute distress. The vital signs are documented above. CARDIAC: There is a regular rate and rhythm.  VASCULAR:  Right neck incision healing with no hematoma no signs of infection NEUROLOGIC: No focal weakness or paresthesias are detected.  Cranial nerves II through XII grossly intact.   DATA:   None  Assessment/Plan:  75 year old male status post right carotid  endarterectomy on 02/22/2021 for an asymptomatic high-grade stenosis.  He has done very well postop.  His incision is healing and he is neurologically intact.  He does have a contralateral moderate 60 to 79% left ICA stenosis that we are following.  Discussed in the setting of asymptomatic disease would reserve surgery for greater than 80% stenosis.  I will see him in 6 months with carotid duplex here in the office for ongoing surveillance.  [name], MD [facility] [place] Office: [phone]

## 2021-03-16 ENCOUNTER — Other Ambulatory Visit: Payer: Self-pay

## 2021-03-16 DIAGNOSIS — I6523 Occlusion and stenosis of bilateral carotid arteries: Secondary | ICD-10-CM

## 2021-06-08 DIAGNOSIS — R011 Cardiac murmur, unspecified: Secondary | ICD-10-CM | POA: Insufficient documentation

## 2021-07-21 ENCOUNTER — Ambulatory Visit: Payer: Medicare PPO | Admitting: Cardiology

## 2021-08-05 ENCOUNTER — Other Ambulatory Visit (HOSPITAL_BASED_OUTPATIENT_CLINIC_OR_DEPARTMENT_OTHER): Payer: Self-pay | Admitting: Family

## 2021-08-05 DIAGNOSIS — I10 Essential (primary) hypertension: Secondary | ICD-10-CM

## 2021-09-21 ENCOUNTER — Encounter: Payer: Self-pay | Admitting: Cardiology

## 2021-09-21 ENCOUNTER — Ambulatory Visit: Payer: Medicare PPO | Admitting: Cardiology

## 2021-09-21 VITALS — BP 128/60 | HR 74 | Ht 68.0 in | Wt 244.6 lb

## 2021-09-21 DIAGNOSIS — I10 Essential (primary) hypertension: Secondary | ICD-10-CM

## 2021-09-21 DIAGNOSIS — I2510 Atherosclerotic heart disease of native coronary artery without angina pectoris: Secondary | ICD-10-CM | POA: Diagnosis not present

## 2021-09-21 DIAGNOSIS — R0602 Shortness of breath: Secondary | ICD-10-CM | POA: Diagnosis not present

## 2021-09-21 DIAGNOSIS — I739 Peripheral vascular disease, unspecified: Secondary | ICD-10-CM

## 2021-09-21 DIAGNOSIS — R0609 Other forms of dyspnea: Secondary | ICD-10-CM

## 2021-09-21 DIAGNOSIS — E782 Mixed hyperlipidemia: Secondary | ICD-10-CM

## 2021-09-21 NOTE — Addendum Note (Signed)
Addended by: Roosvelt Harps R on: 09/21/2021 04:44 PM   Modules accepted: Orders

## 2021-09-21 NOTE — Patient Instructions (Signed)
Medication Instructions:  Your physician recommends that you continue on your current medications as directed. Please refer to the Current Medication list given to you today.  *If you need a refill on your cardiac medications before your next appointment, please call your pharmacy*   Lab Work: Your physician recommends that you return for lab work in: When you come in for Echocardiogram need fasting lipid panel  If you have labs (blood work) drawn today and your tests are completely normal, you will receive your results only by: MyChart Message (if you have MyChart) OR A paper copy in the mail If you have any lab test that is abnormal or we need to change your treatment, we will call you to review the results.   Testing/Procedures: Your physician has requested that you have an echocardiogram. Echocardiography is a painless test that uses sound waves to create images of your heart. It provides your doctor with information about the size and shape of your heart and how well your heart's chambers and valves are working. This procedure takes approximately one hour. There are no restrictions for this procedure.    Follow-Up: At [facility], you and your health needs are our priority.  As part of our continuing mission to provide you with exceptional heart care, we have created designated Provider Care Teams.  These Care Teams include your primary Cardiologist (physician) and Advanced Practice Providers (APPs -  Physician Assistants and Nurse Practitioners) who all work together to provide you with the care you need, when you need it.  We recommend signing up for the patient portal called "MyChart".  Sign up information is provided on this After Visit Summary.  MyChart is used to connect with patients for Virtual Visits (Telemedicine).  Patients are able to view lab/test results, encounter notes, upcoming appointments, etc.  Non-urgent messages can be sent to your provider as well.   To learn  more about what you can do with MyChart, go to [email or web address].    Your next appointment:   6 month(s)  The format for your next appointment:   In Person  Provider:   Gypsy Balsam, MD    Other Instructions   Important Information About Sugar

## 2021-09-21 NOTE — Progress Notes (Signed)
Cardiology Office Note:    Date:  09/21/2021   ID:  Russell Butler, DOB 12/02/1945, MRN [id number]  PCP:  [place], Sarah T, MD  Cardiologist:  Gypsy Balsam, MD    Referring MD: [place], Sarah T, MD   Chief Complaint  Patient presents with   Follow-up  Doing well  History of Present Illness:    Russell Butler is a 76 y.o. male with past medical history significant for peripheral vascular disease, status post carotic endarterectomy on the right side at the end of last year, coronary artery disease with cardiac catheterization done in September 2022.  Cardiac catheterization shows significant two-vessel disease in the cath bridging with lesion appears to be up to 80% stenosis which was hemodynamically significant he also got severe disease of the circumflex at the origin of obtuse marginal 1 at that time decision has been made to pursue medical therapy since he had not but symptoms and dose lesions were difficult for intervention conclusion also was if he keeps having symptoms then will consider even bypass surgery.  He also have history of essential hypertension, dyslipidemia. Comes to my office for follow-up overall seems to be doing well.  He denies have any chest pain tightness squeezing pressure burning chest.  Complain of having some shortness of breath as usual but did not notice any decrease in ability to exercise.  No TIA or CVA-like symptoms.  Past Medical History:  Diagnosis Date   Asthma    Carotid bruit 12/28/2019   Chest pain of uncertain etiology 12/28/2019   Coronary artery disease 03/24/2020   Dyspnea    Essential hypertension    GERD (gastroesophageal reflux disease)    Heart murmur    Hyperlipidemia    Hypertension    Mixed hyperlipidemia    Peripheral vascular disease, unspecified (HCC) up to 79% p both sides carotids 02/24/2020   Snoring    SOB (shortness of breath)    Tinnitus     Past Surgical History:  Procedure Laterality Date   CARDIAC CATHETERIZATION      ENDARTERECTOMY Right 02/22/2021   Procedure: RIGHT CAROTID ENDARTERECTOMY;  Surgeon: [name], MD;  Location: [facility] OR;  Service: Vascular;  Laterality: Right;   INTRAVASCULAR PRESSURE WIRE/FFR STUDY N/A 01/05/2021   Procedure: INTRAVASCULAR PRESSURE WIRE/FFR STUDY;  Surgeon: [place], Peter M, MD;  Location: [facility] INVASIVE CV LAB;  Service: Cardiovascular;  Laterality: N/A;   LEFT HEART CATH AND CORONARY ANGIOGRAPHY N/A 01/05/2021   Procedure: LEFT HEART CATH AND CORONARY ANGIOGRAPHY;  Surgeon: [place], Peter M, MD;  Location: [facility] INVASIVE CV LAB;  Service: Cardiovascular;  Laterality: N/A;   NO PAST SURGERIES     PATCH ANGIOPLASTY Right 02/22/2021   Procedure: PATCH ANGIOPLASTY USING 1CM x 14CM XENOSURE BIOLOGIC PATCH;  Surgeon: [name], MD;  Location: [facility] OR;  Service: Vascular;  Laterality: Right;    Current Medications: Current Meds  Medication Sig   aspirin EC 81 MG tablet Take 81 mg by mouth daily. Swallow whole.   atorvastatin (LIPITOR) 80 MG tablet TAKE 1 TABLET BY MOUTH EVERY DAY (Patient taking differently: Take 80 mg by mouth daily.)   BREO ELLIPTA 100-25 MCG/INH AEPB Inhale 1 puff into the lungs in the morning and at bedtime.   Fluticasone-Salmeterol (WIXELA INHUB IN) Inhale 2 puffs into the lungs in the morning and at bedtime.   hydrochlorothiazide (HYDRODIURIL) 25 MG tablet TAKE 1/2 TABLET BY MOUTH EVERY DAY (Patient taking differently: Take 12.5 mg by mouth.)   lisinopril (ZESTRIL) 40  MG tablet Take 40 mg by mouth daily.   metoprolol succinate (TOPROL XL) 25 MG 24 hr tablet Take 1 tablet (25 mg total) by mouth daily. (Patient taking differently: Take 12.5 mg by mouth daily.)     Allergies:   Patient has no known allergies.   Social History   Socioeconomic History   Marital status: Single    Spouse name: Not on file   Number of children: Not on file   Years of education: Not on file   Highest education level: Not on file  Occupational History   Not  on file  Tobacco Use   Smoking status: Former    Types: Cigarettes    Quit date: 12/28/1979    Years since quitting: 41.7   Smokeless tobacco: Never  Substance and Sexual Activity   Alcohol use: Yes    Alcohol/week: 10.0 standard drinks    Types: 10 Glasses of wine per week   Drug use: Never   Sexual activity: Not on file  Other Topics Concern   Not on file  Social History Narrative   Not on file   Social Determinants of Health   Financial Resource Strain: Not on file  Food Insecurity: Not on file  Transportation Needs: Not on file  Physical Activity: Not on file  Stress: Not on file  Social Connections: Not on file     Family History: The patient's family history includes Cancer in his paternal grandfather; Depression in his mother; Diabetes in his maternal grandfather; Hypercholesterolemia in his brother; Hypertension in his brother; Liver disease in his father. ROS:   Please see the history of present illness.    All 14 point review of systems negative except as described per history of present illness  EKGs/Labs/Other Studies Reviewed:      Recent Labs: 02/14/2021: ALT 24 02/23/2021: BUN 21; Creatinine, Ser 1.18; Hemoglobin 11.9; Platelets 151; Potassium 4.5; Sodium 134  Recent Lipid Panel    Component Value Date/Time   CHOL 108 02/23/2021 0211   CHOL 139 05/04/2020 1050   TRIG 54 02/23/2021 0211   HDL 49 02/23/2021 0211   HDL 47 05/04/2020 1050   CHOLHDL 2.2 02/23/2021 0211   VLDL 11 02/23/2021 0211   LDLCALC 48 02/23/2021 0211   LDLCALC 73 05/04/2020 1050    Physical Exam:    VS:  BP 128/60 (BP Location: Left Arm, Patient Position: Sitting)   Pulse 74   Ht 5\' 8"  (1.727 m)   Wt 244 lb 9.6 oz (110.9 kg)   SpO2 94%   BMI 37.19 kg/m     Wt Readings from Last 3 Encounters:  09/21/21 244 lb 9.6 oz (110.9 kg)  03/14/21 242 lb (109.8 kg)  02/23/21 241 lb (109.3 kg)     GEN:  Well nourished, well developed in no acute distress HEENT: Normal NECK:  No JVD; No carotid bruits LYMPHATICS: No lymphadenopathy CARDIAC: RRR, no murmurs, no rubs, no gallops RESPIRATORY:  Clear to auscultation without rales, wheezing or rhonchi  ABDOMEN: Soft, non-tender, non-distended MUSCULOSKELETAL:  No edema; No deformity  SKIN: Warm and dry LOWER EXTREMITIES: no swelling NEUROLOGIC:  Alert and oriented x 3 PSYCHIATRIC:  Normal affect   ASSESSMENT:    1. Peripheral vascular disease, unspecified (HCC) up to 79% p both sides carotids   2. Coronary artery disease involving native coronary artery of native heart without angina pectoris   3. Essential hypertension   4. SOB (shortness of breath)   5. Dyspnea on exertion  PLAN:    In order of problems listed above:  Status post carotic endarterectomy he does have a scheduled carotic ultrasound done next week by vascular group. Advanced coronary artery disease felt to be difficult for intervention as he cardiac catheterization.  However he appears to have very little if at all symptoms therefore we will continue antiplatelets therapy we will continue lipid lowering therapy as well as beta-blockade. Essential hypertension blood pressure well controlled Shortness of breath stable present asking to have echocardiogram done to make sure that left ventricle ejection fraction still preserved Dyslipidemia he is on high intense statin form of Lipitor 80 last fasting lipid panel I have is from 02/23/2021 with LDL of 48 HDL 49.  We will schedule him to have fasting lipid profile done   Medication Adjustments/Labs and Tests Ordered: Current medicines are reviewed at length with the patient today.  Concerns regarding medicines are outlined above.  No orders of the defined types were placed in this encounter.  Medication changes: No orders of the defined types were placed in this encounter.   Signed, Georgeanna Leaobert J. Gabryela Kimbrell, MD, [facility] YorkFACC 09/21/2021 4:29 PM    [facility]

## 2021-09-26 ENCOUNTER — Ambulatory Visit: Payer: Medicare PPO | Admitting: Vascular Surgery

## 2021-09-26 ENCOUNTER — Ambulatory Visit (HOSPITAL_COMMUNITY)
Admission: RE | Admit: 2021-09-26 | Discharge: 2021-09-26 | Disposition: A | Discharge status: Home / Self Care | Payer: Medicare PPO | Source: Ambulatory Visit | Attending: Vascular Surgery | Admitting: Vascular Surgery

## 2021-09-26 ENCOUNTER — Encounter: Payer: Self-pay | Admitting: Vascular Surgery

## 2021-09-26 VITALS — BP 114/60 | HR 75 | Temp 98.3°F | Resp 16 | Ht 68.0 in | Wt 243.0 lb

## 2021-09-26 DIAGNOSIS — I6523 Occlusion and stenosis of bilateral carotid arteries: Secondary | ICD-10-CM

## 2021-09-26 NOTE — Progress Notes (Signed)
Patient name: Russell Butler MRN: [id number] DOB: [birth year] Sex: male  REASON FOR CONSULT: 15-month follow-up for carotid artery surveillance  HPI: Russell Butler is a 76 y.o. male, with history of hypertension, hyperlipidemia, coronary artery disease that presents for 6 month follow-up for carotid artery surveillance.  He had a right carotid endarterectomy on 02/22/2021 for an asymptomatic high-grade stenosis.  We are also following a moderate 60-79% left ICA stenosis.  He reports no issues over the last 6 months as it relates to stroke or TIA symptoms including vision loss or weakness.  He does not smoke.  He did go to [place] for several months.  Remains on aspirin and statin.   Past Medical History:  Diagnosis Date   Asthma    Carotid bruit 12/28/2019   Chest pain of uncertain etiology [id number]   Coronary artery disease 03/24/2020   Dyspnea    Essential hypertension    GERD (gastroesophageal reflux disease)    Heart murmur    Hyperlipidemia    Hypertension    Mixed hyperlipidemia    Peripheral vascular disease, unspecified ([facility]) up to 79% p both sides carotids 02/24/2020   Snoring    SOB (shortness of breath)    Tinnitus     Past Surgical History:  Procedure Laterality Date   CARDIAC CATHETERIZATION     ENDARTERECTOMY Right 02/22/2021   Procedure: RIGHT CAROTID ENDARTERECTOMY;  Surgeon: Marty Heck, MD;  Location: [place];  Service: Vascular;  Laterality: Right;   INTRAVASCULAR PRESSURE WIRE/FFR STUDY N/A 01/05/2021   Procedure: INTRAVASCULAR PRESSURE WIRE/FFR STUDY;  Surgeon: Martinique, Peter M, MD;  Location: [facility];  Service: Cardiovascular;  Laterality: N/A;   LEFT HEART CATH AND CORONARY ANGIOGRAPHY N/A 01/05/2021   Procedure: LEFT HEART CATH AND CORONARY ANGIOGRAPHY;  Surgeon: Martinique, Peter M, MD;  Location: [facility];  Service: Cardiovascular;  Laterality: N/A;   NO PAST SURGERIES     PATCH ANGIOPLASTY Right 02/22/2021   Procedure:  PATCH ANGIOPLASTY USING 1CM x 14CM XENOSURE BIOLOGIC PATCH;  Surgeon: Marty Heck, MD;  [facility];  Service: Vascular;  Laterality: Right;    Family History  Problem Relation Age of Onset   Depression Mother    Liver disease Father    Hypertension Brother    Hypercholesterolemia Brother    Diabetes Maternal Grandfather    Cancer Paternal Grandfather     SOCIAL HISTORY: Social History   Socioeconomic History   Marital status: Single    Spouse name: Not on file   Number of children: Not on file   Years of education: Not on file   Highest education level: Not on file  Occupational History   Not on file  Tobacco Use   Smoking status: Former    Types: Cigarettes    Quit date: 12/28/1979    Years since quitting: 41.7   Smokeless tobacco: Never  Substance and Sexual Activity   Alcohol use: Yes    Alcohol/week: 10.0 standard drinks    Types: 10 Glasses of wine per week   Drug use: Never   Sexual activity: Not on file  Other Topics Concern   Not on file  Social History Narrative   Not on file   Social Determinants of Health   Financial Resource Strain: Not on file  Food Insecurity: Not on file  Transportation Needs: Not on file  Physical Activity: Not on file  Stress: Not on file  Social Connections: Not on file  Intimate  Partner Violence: Not on file    No Known Allergies  Current Outpatient Medications  Medication Sig Dispense Refill   aspirin EC 81 MG tablet Take 81 mg by mouth daily. Swallow whole.     atorvastatin (LIPITOR) 80 MG tablet TAKE 1 TABLET BY MOUTH EVERY DAY (Patient taking differently: Take 80 mg by mouth daily.) 90 tablet 3   Fluticasone-Salmeterol (WIXELA INHUB IN) Inhale 2 puffs into the lungs in the morning and at bedtime.     hydrochlorothiazide (HYDRODIURIL) 25 MG tablet TAKE 1/2 TABLET BY MOUTH EVERY DAY (Patient taking differently: Take 12.5 mg by mouth.) 45 tablet 1   lisinopril (ZESTRIL) 40 MG tablet Take 40 mg by mouth  daily.     metoprolol succinate (TOPROL XL) 25 MG 24 hr tablet Take 1 tablet (25 mg total) by mouth daily. (Patient taking differently: Take 12.5 mg by mouth daily.) 30 tablet 11   BREO ELLIPTA 100-25 MCG/INH AEPB Inhale 1 puff into the lungs in the morning and at bedtime.     No current facility-administered medications for this visit.    REVIEW OF SYSTEMS:  [X]  denotes positive finding, [ ]  denotes negative finding Cardiac  Comments:  Chest pain or chest pressure:    Shortness of breath upon exertion:    Short of breath when lying flat:    Irregular heart rhythm:        Vascular    Pain in calf, thigh, or hip brought on by ambulation:    Pain in feet at night that wakes you up from your sleep:     Blood clot in your veins:    Leg swelling:         Pulmonary    Oxygen at home:    Productive cough:     Wheezing:         Neurologic    Sudden weakness in arms or legs:     Sudden numbness in arms or legs:     Sudden onset of difficulty speaking or slurred speech:    Temporary loss of vision in one eye:     Problems with dizziness:         Gastrointestinal    Blood in stool:     Vomited blood:         Genitourinary    Burning when urinating:     Blood in urine:        Psychiatric    Major depression:         Hematologic    Bleeding problems:    Problems with blood clotting too easily:        Skin    Rashes or ulcers:        Constitutional    Fever or chills:      PHYSICAL EXAM: Vitals:   09/26/21 1452 09/26/21 1454  BP: 110/60 114/60  Pulse: 75 75  Resp: 16   Temp: 98.3 F (36.8 C)   TempSrc: Temporal   SpO2: 97%   Weight: 243 lb (110.2 kg)   Height: 5\' 8"  (1.727 m)     GENERAL: The patient is a well-nourished male, in no acute distress. The vital signs are documented above. CARDIAC: There is a regular rate and rhythm.  VASCULAR:  Right neck incision healed NEUROLOGIC: No focal weakness or paresthesias are detected.  Cranial nerves II through XII  grossly intact.   DATA:   Carotid duplex today reviewed and right carotid endarterectomy site is widely patent by duplex. He  has a 60 to 79% left ICA stenosis by velocity criteria.  Assessment/Plan:  77 year old male status post right carotid endarterectomy on 02/22/2021 for an asymptomatic high-grade stenosis.  He has done very well with no neurologic symptoms over the last 6 months.  His right carotid endarterectomy site is widely patent on duplex.  He has a 60 to 79% stenosis in the left ICA which is stable over the past 6 months.  Discussed in the absence of neurologic symptoms, we reserve surgical invention for greater than 80% stenosis.  I will see him again in 6 months with carotid duplex here in the office.  Marty Heck, MD [facility] Office: [phone]

## 2021-09-28 ENCOUNTER — Telehealth: Payer: Self-pay | Admitting: Cardiology

## 2021-09-28 ENCOUNTER — Ambulatory Visit (INDEPENDENT_AMBULATORY_CARE_PROVIDER_SITE_OTHER): Payer: Medicare PPO

## 2021-09-28 ENCOUNTER — Other Ambulatory Visit: Payer: Self-pay | Admitting: Cardiology

## 2021-09-28 DIAGNOSIS — R0609 Other forms of dyspnea: Secondary | ICD-10-CM

## 2021-09-28 LAB — ECHOCARDIOGRAM COMPLETE
Area-P 1/2: 4.63 cm2
Calc EF: 49.9 %
S' Lateral: 3.5 cm
Single Plane A2C EF: 48.9 %
Single Plane A4C EF: 49.7 %

## 2021-09-28 NOTE — Telephone Encounter (Signed)
Unable to reach the patient (left a message)however, lab order form 5/18 faxed as requested.

## 2021-09-28 NOTE — Telephone Encounter (Signed)
Patient states he was told to get labs done at [facility], but they had not received the lab orders. He states the fax number is: [phone]. He would like a call back when the orders are faxed.

## 2021-09-29 ENCOUNTER — Other Ambulatory Visit: Payer: Self-pay | Admitting: *Deleted

## 2021-09-29 ENCOUNTER — Telehealth: Payer: Self-pay

## 2021-09-29 DIAGNOSIS — I6523 Occlusion and stenosis of bilateral carotid arteries: Secondary | ICD-10-CM

## 2021-09-29 LAB — LIPID PANEL W/O CHOL/HDL RATIO
Cholesterol, Total: 118 mg/dL (ref 100–199)
HDL: 43 mg/dL (ref >39)
LDL Chol Calc (NIH): 59 mg/dL (ref 0–99)
Triglycerides: 77 mg/dL (ref 0–149)
VLDL Cholesterol Cal: 16 mg/dL (ref 5–40)

## 2021-09-29 NOTE — Telephone Encounter (Signed)
Patient notified of results.

## 2021-09-29 NOTE — Telephone Encounter (Signed)
Returned patient call, message addressed

## 2021-09-29 NOTE — Telephone Encounter (Signed)
Pt returning call and transferred to Evlyn Kanner, RN

## 2021-09-29 NOTE — Telephone Encounter (Signed)
-----   Message from Georgeanna Lea, MD sent at 09/29/2021  9:10 AM EDT ----- Echocardiogram showed normal left ventricle ejection fraction, overall looks good

## 2021-12-19 ENCOUNTER — Other Ambulatory Visit: Payer: Self-pay | Admitting: Cardiology

## 2021-12-19 NOTE — Telephone Encounter (Signed)
Rx refill sent to pharmacy. 

## 2022-02-15 ENCOUNTER — Other Ambulatory Visit: Payer: Self-pay | Admitting: Cardiology

## 2022-02-17 ENCOUNTER — Other Ambulatory Visit (HOSPITAL_BASED_OUTPATIENT_CLINIC_OR_DEPARTMENT_OTHER): Payer: Self-pay | Admitting: Cardiology

## 2022-02-17 DIAGNOSIS — I10 Essential (primary) hypertension: Secondary | ICD-10-CM

## 2022-02-19 NOTE — Telephone Encounter (Signed)
Refill to pharmacy for hydrochlorothiazide

## 2022-03-05 ENCOUNTER — Encounter (INDEPENDENT_AMBULATORY_CARE_PROVIDER_SITE_OTHER): Payer: Self-pay

## 2022-03-21 IMAGING — CT CT ANGIO NECK
1 of 7 series · 7 of 33 positions shown · IV contrast (omnipaque)
Comparison: None.

CLINICAL DATA: Bilateral carotid artery stenosis; stroke/TIA assess
extracranial arteries

EXAM:
CT ANGIOGRAPHY NECK
TECHNIQUE: Multidetector CT imaging of the neck was performed using the
standard protocol during bolus administration of intravenous
contrast. Multiplanar CT image reconstructions and MIPs were
obtained to evaluate the vascular anatomy. Carotid stenosis
measurements (when applicable) are obtained utilizing NASCET
criteria, using the distal internal carotid diameter as the
denominator.
CONTRAST:  75mL OMNIPAQUE IOHEXOL 350 MG/ML SOLN

[Series 8: ax thin · axial · 0.36mm/px · z∈[+1380,+1586]mm · 7 of 292 slices shown]
[im 37/292  soft-tissue]
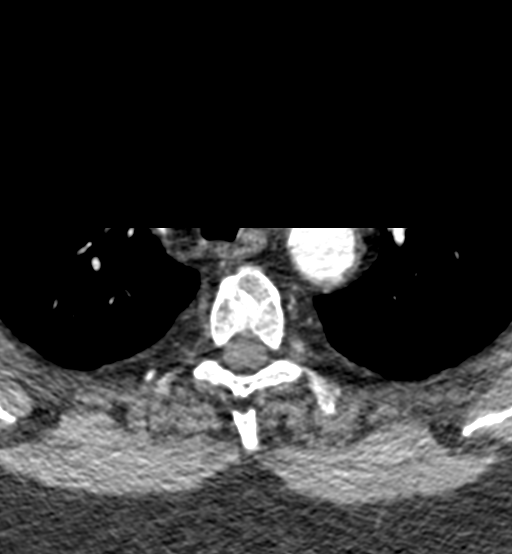
[im 73/292  bone]
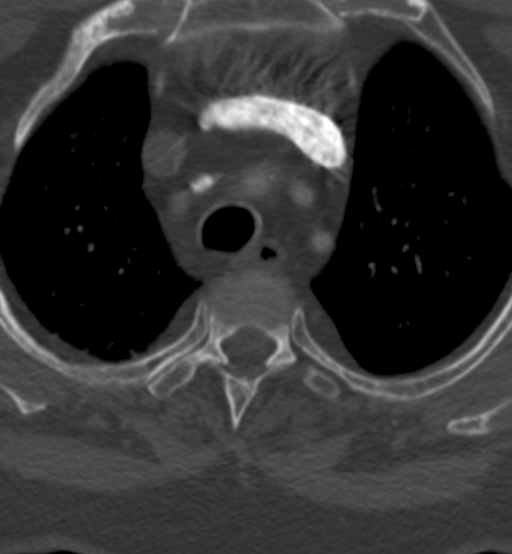
[im 110/292  soft-tissue]
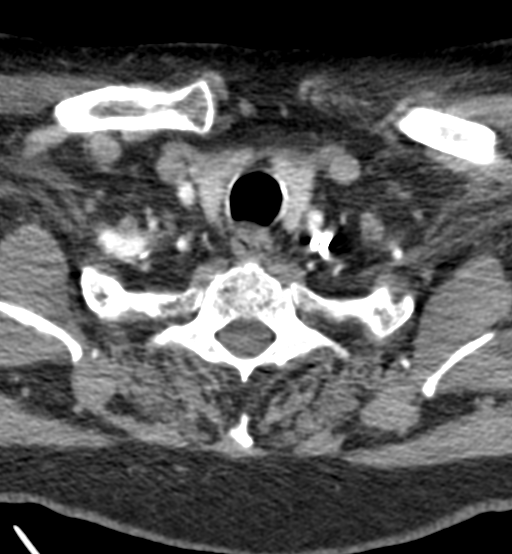
[im 146/292  bone]
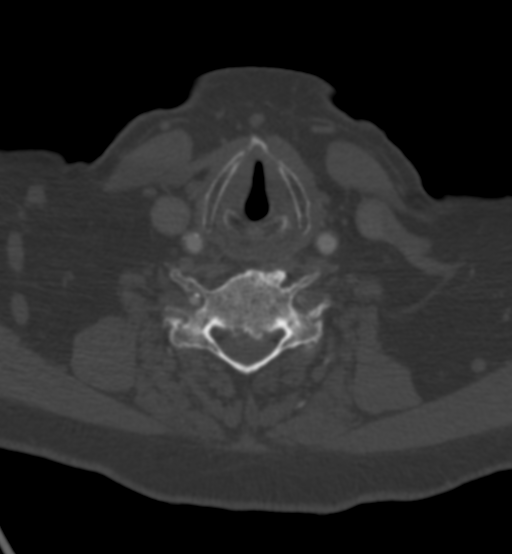
[im 182/292  soft-tissue]
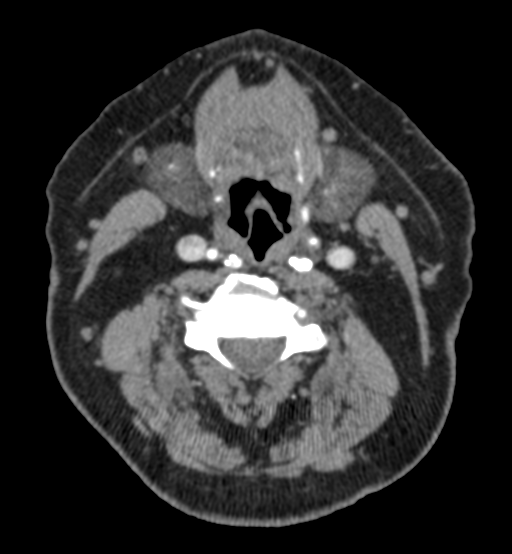
[im 219/292  bone]
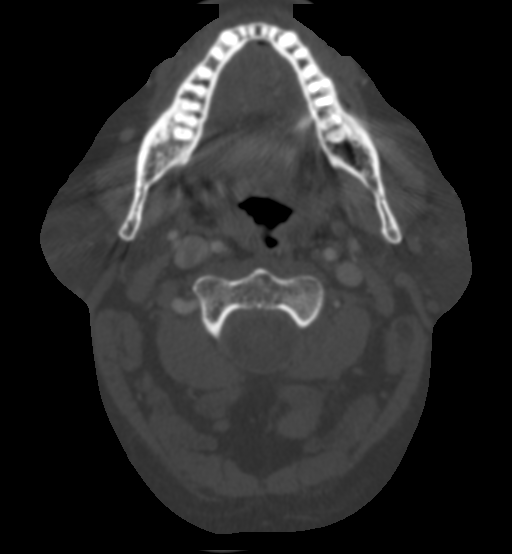
[im 255/292  soft-tissue]
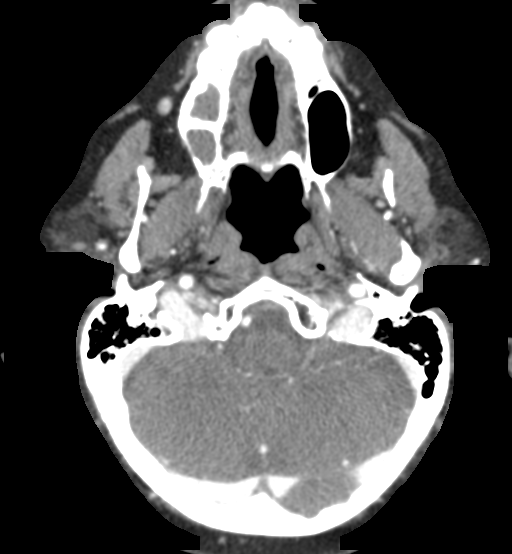

[7 of 33 positions shown; findings below may reference images not displayed]

FINDINGS: Streak artifact is present from dental amalgam and venous contrast.

Aortic arch: Calcified plaque along the arch and patent great vessel
origins. No high-grade proximal subclavian artery stenosis.

Right carotid system: Patent. Mild calcified plaque along the common
carotid. There is heavy calcified plaque at the bifurcation and
along the proximal internal carotid. Focal noncalcified plaque is
also present along the proximal internal carotid. Stenosis measures
up to 82%.

Left carotid system: Patent. Mild mixed plaque along the common
carotid with less than 50% stenosis. Calcified plaque at the
bifurcation and along the proximal internal carotid resulting in up
to 60% stenosis.

Vertebral arteries: Patent. Right vertebral artery is dominant. Mild
calcified plaque along the right vertebral artery. There is plaque
adjacent to the origins without apparent high-grade stenosis, noting
streak artifact to this region.

Skeleton: Multilevel degenerative changes of the cervical spine.

Other neck: Complete opacification of the smaller right maxillary
sinus with mild bowing of the floor of the orbit; may reflect status
atelectasis.

Upper chest: Included upper lungs are clear.
IMPRESSION: Bilateral common and internal carotid artery atherosclerosis. There
is up to 82% stenosis of the proximal right ICA and 60% stenosis of
the proximal left ICA.

## 2022-04-04 ENCOUNTER — Ambulatory Visit: Payer: Medicare PPO | Attending: Cardiology | Admitting: Cardiology

## 2022-04-04 ENCOUNTER — Encounter: Payer: Self-pay | Admitting: Cardiology

## 2022-04-04 VITALS — BP 128/78 | HR 72 | Ht 68.0 in | Wt 245.4 lb

## 2022-04-04 DIAGNOSIS — I10 Essential (primary) hypertension: Secondary | ICD-10-CM

## 2022-04-04 DIAGNOSIS — E782 Mixed hyperlipidemia: Secondary | ICD-10-CM | POA: Diagnosis not present

## 2022-04-04 DIAGNOSIS — I6522 Occlusion and stenosis of left carotid artery: Secondary | ICD-10-CM

## 2022-04-04 DIAGNOSIS — I739 Peripheral vascular disease, unspecified: Secondary | ICD-10-CM | POA: Diagnosis not present

## 2022-04-04 DIAGNOSIS — I2510 Atherosclerotic heart disease of native coronary artery without angina pectoris: Secondary | ICD-10-CM | POA: Diagnosis not present

## 2022-04-04 NOTE — Patient Instructions (Addendum)
Medication Instructions:  Your physician recommends that you continue on your current medications as directed. Please refer to the Current Medication list given to you today.  *If you need a refill on your cardiac medications before your next appointment, please call your pharmacy*   Lab Work: None Ordered If you have labs (blood work) drawn today and your tests are completely normal, you will receive your results only by: MyChart Message (if you have MyChart) OR A paper copy in the mail If you have any lab test that is abnormal or we need to change your treatment, we will call you to review the results.   Testing/Procedures: Your physician has requested that you have a carotid duplex. This test is an ultrasound of the carotid arteries in your neck. It looks at blood flow through these arteries that supply the brain with blood. Allow one hour for this exam. There are no restrictions or special instructions.    Follow-Up: At [facility], you and your health needs are our priority.  As part of our continuing mission to provide you with exceptional heart care, we have created designated Provider Care Teams.  These Care Teams include your primary Cardiologist (physician) and Advanced Practice Providers (APPs -  Physician Assistants and Nurse Practitioners) who all work together to provide you with the care you need, when you need it.  We recommend signing up for the patient portal called "MyChart".  Sign up information is provided on this After Visit Summary.  MyChart is used to connect with patients for Virtual Visits (Telemedicine).  Patients are able to view lab/test results, encounter notes, upcoming appointments, etc.  Non-urgent messages can be sent to your provider as well.   To learn more about what you can do with MyChart, go to [email or web address].    Your next appointment:   6 month(s)  The format for your next appointment:   In Person  Provider:   Robert Krasowski, MD     Other Instructions NA  

## 2022-04-04 NOTE — Progress Notes (Signed)
Cardiology Office Note:    Date:  04/04/2022   ID:  Russell Butler, DOB 12-26-1945, MRN [id number]  PCP:  [place], Sarah T, MD  Cardiologist:  Gypsy Balsam, MD    Referring MD: [place], Sarah T, MD   Chief Complaint  Patient presents with   Follow-up    History of Present Illness:    Russell Butler is a 76 y.o. male with past medical history significant for peripheral vascular disease, status post carotic endarterectomy on the right side at the end of last year, coronary artery disease status postcardiac catheterization done in September 2022 cardiac catheterization showed significant two-vessel disease with mid LAD having some bridging with lesion up to 80% which appears to be hemodynamically significant, also 90% obtuse marginal 1 and obtuse marginal 2, however, since he is completely asymptomatic decision has been made to manage him medically because of difficulty accessing those lesions.  Additional problem include essential hypertension, dyslipidemia. He is in my office today for follow-up.  Doing well.  Denies have any chest pain tightness squeezing pressure burning chest no palpitation dizziness swelling of lower extremities.  Overall he is doing well.  Past Medical History:  Diagnosis Date   Asthma    Carotid bruit 12/28/2019   Chest pain of uncertain etiology 12/28/2019   Coronary artery disease 03/24/2020   Dyspnea    Essential hypertension    GERD (gastroesophageal reflux disease)    Heart murmur    Hyperlipidemia    Hypertension    Mixed hyperlipidemia    Peripheral vascular disease, unspecified (HCC) up to 79% p both sides carotids 02/24/2020   Snoring    SOB (shortness of breath)    Tinnitus     Past Surgical History:  Procedure Laterality Date   CARDIAC CATHETERIZATION     ENDARTERECTOMY Right 02/22/2021   Procedure: RIGHT CAROTID ENDARTERECTOMY;  Surgeon: [name], MD;  Location: [facility] OR;  Service: Vascular;  Laterality: Right;   INTRAVASCULAR  PRESSURE WIRE/FFR STUDY N/A 01/05/2021   Procedure: INTRAVASCULAR PRESSURE WIRE/FFR STUDY;  Surgeon: Swaziland, Peter M, MD;  Location: [facility];  Service: Cardiovascular;  Laterality: N/A;   LEFT HEART CATH AND CORONARY ANGIOGRAPHY N/A 01/05/2021   Procedure: LEFT HEART CATH AND CORONARY ANGIOGRAPHY;  Surgeon: Swazil[place], Peter M, MD;  Location: [facility];  Service: Cardiovascular;  Laterality: N/A;   NO PAST SURGERIES     PATCH ANGIOPLASTY Right 02/22/2021   Procedure: PATCH ANGIOPLASTY USING 1CM x 14CM XENOSURE BIOLOGIC PATCH;  Surgeon: [name], MD;  Location: [facility];  Service: Vascular;  Laterality: Right;    Current Medications: Current Meds  Medication Sig   aspirin EC 81 MG tablet Take 81 mg by mouth daily. Swallow whole.   atorvastatin (LIPITOR) 80 MG tablet Take 1 tablet (80 mg total) by mouth daily.   BREO ELLIPTA 100-25 MCG/INH AEPB Inhale 1 puff into the lungs in the morning and at bedtime.   Fluticasone-Salmeterol (WIXELA INHUB IN) Inhale 2 puffs into the lungs in the morning and at bedtime.   hydrochlorothiazide (HYDRODIURIL) 25 MG tablet TAKE 1/2 TABLET BY MOUTH EVERY DAY (Patient taking differently: Take 12.5 mg by mouth daily.)   lisinopril (ZESTRIL) 40 MG tablet Take 40 mg by mouth daily.   metoprolol succinate (TOPROL-XL) 25 MG 24 hr tablet Take 0.5 tablets (12.5 mg total) by mouth daily.     Allergies:   Patient has no known allergies.   Social History   Socioeconomic History  Marital status: Single    Spouse name: Not on file   Number of children: Not on file   Years of education: Not on file   Highest education level: Not on file  Occupational History   Not on file  Tobacco Use   Smoking status: Former    Types: Cigarettes    Quit date: 12/28/1979    Years since quitting: 42.2   Smokeless tobacco: Never  Substance and Sexual Activity   Alcohol use: Yes    Alcohol/week: 10.0 standard drinks of alcohol    Types: 10 Glasses of wine  per week   Drug use: Never   Sexual activity: Not on file  Other Topics Concern   Not on file  Social History Narrative   Not on file   Social Determinants of Health   Financial Resource Strain: Not on file  Food Insecurity: Not on file  Transportation Needs: Not on file  Physical Activity: Not on file  Stress: Not on file  Social Connections: Not on file     Family History: The patient's family history includes Cancer in his paternal grandfather; Depression in his mother; Diabetes in his maternal grandfather; Hypercholesterolemia in his brother; Hypertension in his brother; Liver disease in his father. ROS:   Please see the history of present illness.    All 14 point review of systems negative except as described per history of present illness  EKGs/Labs/Other Studies Reviewed:      Recent Labs: No results found for requested labs within last 365 days.  Recent Lipid Panel    Component Value Date/Time   CHOL 118 09/28/2021 1106   TRIG 77 09/28/2021 1106   HDL 43 09/28/2021 1106   CHOLHDL 2.2 02/23/2021 0211   VLDL 11 02/23/2021 0211   LDLCALC 59 09/28/2021 1106    Physical Exam:    VS:  BP 128/78 (BP Location: Left Arm, Patient Position: Sitting)   Pulse 72   Ht 5\' 8"  (1.727 m)   Wt 245 lb 6.4 oz (111.3 kg)   SpO2 98%   BMI 37.31 kg/m     Wt Readings from Last 3 Encounters:  04/04/22 245 lb 6.4 oz (111.3 kg)  09/26/21 243 lb (110.2 kg)  09/21/21 244 lb 9.6 oz (110.9 kg)     GEN:  Well nourished, well developed in no acute distress HEENT: Normal NECK: No JVD; No carotid bruits LYMPHATICS: No lymphadenopathy CARDIAC: RRR, no murmurs, no rubs, no gallops RESPIRATORY:  Clear to auscultation without rales, wheezing or rhonchi  ABDOMEN: Soft, non-tender, non-distended MUSCULOSKELETAL:  No edema; No deformity  SKIN: Warm and dry LOWER EXTREMITIES: no swelling NEUROLOGIC:  Alert and oriented x 3 PSYCHIATRIC:  Normal affect   ASSESSMENT:    1. Essential  hypertension   2. Coronary artery disease involving native coronary artery of native heart without angina pectoris   3. Mixed hyperlipidemia   4. Peripheral vascular disease, unspecified (HCC) up to 79% p both sides carotids    PLAN:    In order of problems listed above:  Coronary artery disease with significant two-vessel disease, however, difficult for intervention, he is asymptomatic and doing well clinically.  He is able to work and does things with some difficulties but he is said that he is doing quite well.  We in the meantime we will continue antiplatelets therapy, he is also on very small dose of beta-blocker because he is unable to tolerate higher dosages of that medication.  I will continue Peripheral vascular  disease.  Carotic ultrasounds performed in May of this year, status post right carotic endarterectomy, left side got 60 to 79% stenosis.  Will repeat the study again Mixed dyslipidemia he is on Lipitor 80 which is fine his statin I did review K PN which show me his LDL 59 HDL 40 this is from May of this year we will continue present management.   Medication Adjustments/Labs and Tests Ordered: Current medicines are reviewed at length with the patient today.  Concerns regarding medicines are outlined above.  Orders Placed This Encounter  Procedures   EKG 12-Lead   Medication changes: No orders of the defined types were placed in this encounter.   Signed, Georgeanna Lea, MD, [facility] 04/04/2022 11:16 AM    [facility]

## 2022-04-23 ENCOUNTER — Ambulatory Visit: Payer: Medicare PPO | Attending: Cardiology

## 2022-04-23 DIAGNOSIS — I6522 Occlusion and stenosis of left carotid artery: Secondary | ICD-10-CM | POA: Diagnosis not present

## 2022-05-02 ENCOUNTER — Other Ambulatory Visit: Payer: Self-pay

## 2022-05-02 DIAGNOSIS — I10 Essential (primary) hypertension: Secondary | ICD-10-CM

## 2022-05-02 MED ORDER — HYDROCHLOROTHIAZIDE 25 MG PO TABS: 12.5000 mg | ORAL_TABLET | Freq: Every day | ORAL | 3 refills | Status: DC

## 2022-06-19 ENCOUNTER — Ambulatory Visit: Payer: Medicare PPO | Admitting: Vascular Surgery

## 2022-06-19 ENCOUNTER — Encounter: Payer: Self-pay | Admitting: Vascular Surgery

## 2022-06-19 VITALS — BP 134/75 | HR 67 | Temp 98.4°F | Resp 16 | Ht 68.0 in | Wt 245.0 lb

## 2022-06-19 DIAGNOSIS — I6523 Occlusion and stenosis of bilateral carotid arteries: Secondary | ICD-10-CM

## 2022-06-19 NOTE — Progress Notes (Signed)
Patient name: Russell Butler MRN: [id number] DOB: 1945/09/23 Sex: male  REASON FOR CONSULT: 26-monthfollow-up for carotid artery surveillance  HPI: Russell Sawayais a 77y.o. male, with history of hypertension, hyperlipidemia, coronary artery disease that presents for 6 month follow-up for carotid artery surveillance.  He had a right carotid endarterectomy on 02/22/2021 for an asymptomatic high-grade stenosis.  We are also following a moderate 60-79% left ICA stenosis.  He reports no issues over the last 6 months as it relates to stroke or TIA symptoms including vision loss or weakness.  He does not smoke.  Remains on aspirin and statin.  No new concerns today.   Past Medical History:  Diagnosis Date   Asthma    Carotid bruit 12/28/2019   Chest pain of uncertain etiology [id number]  Coronary artery disease 03/24/2020   Dyspnea    Essential hypertension    GERD (gastroesophageal reflux disease)    Heart murmur    Hyperlipidemia    Hypertension    Mixed hyperlipidemia    Peripheral vascular disease, [place] up to 79% p both sides carotids 02/24/2020   Snoring    SOB (shortness of breath)    Tinnitus     Past Surgical History:  Procedure Laterality Date   CARDIAC CATHETERIZATION     ENDARTERECTOMY Right 02/22/2021   Procedure: RIGHT CAROTID ENDARTERECTOMY;  Surgeon: CMarty Heck MD;  [facility]: [facility]  Service: Vascular;  Laterality: Right;   INTRAVASCULAR PRESSURE WIRE/FFR STUDY N/A 01/05/2021   Procedure: INTRAVASCULAR PRESSURE WIRE/FFR STUDY;  Surgeon: JMartinique Peter M, MD;  [facility];  Service: Cardiovascular;  Laterality: N/A;   LEFT HEART CATH AND CORONARY ANGIOGRAPHY N/A 01/05/2021   Procedure: LEFT HEART CATH AND CORONARY ANGIOGRAPHY;  Surgeon: JMartinique Peter M, MD;  [facility];  Service: Cardiovascular;  Laterality: N/A;   NO PAST SURGERIES     PATCH ANGIOPLASTY Right 02/22/2021   Procedure: PATCH ANGIOPLASTY  USING 1CM x 14CM XENOSURE BIOLOGIC PATCH;  Surgeon: CMarty Heck MD;  [facility];  Service: Vascular;  Laterality: Right;    Family History  Problem Relation Age of Onset   Depression Mother    Liver disease Father    Hypertension Brother    Hypercholesterolemia Brother    Diabetes Maternal Grandfather    Cancer Paternal Grandfather     SOCIAL HISTORY: Social History   Socioeconomic History   Marital status: Single    Spouse name: Not on file   Number of children: Not on file   Years of education: Not on file   Highest education level: Not on file  Occupational History   Not on file  Tobacco Use   Smoking status: Former    Types: Cigarettes    Quit date: 12/28/1979    Years since quitting: 42.5   Smokeless tobacco: Never  Substance and Sexual Activity   Alcohol use: Yes    Alcohol/week: 10.0 standard drinks of alcohol    Types: 10 Glasses of wine per week   Drug use: Never   Sexual activity: Not on file  Other Topics Concern   Not on file  Social History Narrative   Not on file   Social Determinants of Health   Financial Resource Strain: Not on file  Food Insecurity: Not on file  Transportation Needs: Not on file  Physical Activity: Not on file  Stress: Not on file  Social Connections: Not on file  Intimate Partner Violence:  Not on file    No Known Allergies  Current Outpatient Medications  Medication Sig Dispense Refill   aspirin EC 81 MG tablet Take 81 mg by mouth daily. Swallow whole.     atorvastatin (LIPITOR) 80 MG tablet Take 1 tablet (80 mg total) by mouth daily. 90 tablet 1   Fluticasone-Salmeterol (WIXELA INHUB IN) Inhale 2 puffs into the lungs in the morning and at bedtime.     hydrochlorothiazide (HYDRODIURIL) 25 MG tablet Take 0.5 tablets (12.5 mg total) by mouth daily. 45 tablet 3   lisinopril (ZESTRIL) 40 MG tablet Take 40 mg by mouth daily.     metoprolol succinate (TOPROL-XL) 25 MG 24 hr tablet Take 0.5 tablets (12.5 mg total)  by mouth daily. 45 tablet 3   BREO ELLIPTA 100-25 MCG/INH AEPB Inhale 1 puff into the lungs in the morning and at bedtime.     No current facility-administered medications for this visit.    REVIEW OF SYSTEMS:  [X]$  denotes positive finding, [ ]$  denotes negative finding Cardiac  Comments:  Chest pain or chest pressure:    Shortness of breath upon exertion:    Short of breath when lying flat:    Irregular heart rhythm:        Vascular    Pain in calf, thigh, or hip brought on by ambulation:    Pain in feet at night that wakes you up from your sleep:     Blood clot in your veins:    Leg swelling:         Pulmonary    Oxygen at home:    Productive cough:     Wheezing:         Neurologic    Sudden weakness in arms or legs:     Sudden numbness in arms or legs:     Sudden onset of difficulty speaking or slurred speech:    Temporary loss of vision in one eye:     Problems with dizziness:         Gastrointestinal    Blood in stool:     Vomited blood:         Genitourinary    Burning when urinating:     Blood in urine:        Psychiatric    Major depression:         Hematologic    Bleeding problems:    Problems with blood clotting too easily:        Skin    Rashes or ulcers:        Constitutional    Fever or chills:      PHYSICAL EXAM: Vitals:   06/19/22 1331 06/19/22 1338  BP: 134/74 134/75  Pulse: 67 67  Resp: 16   Temp: 98.4 F (36.9 C)   TempSrc: Temporal   SpO2: 98%   Weight: 245 lb (111.1 kg)   Height: 5' 8"$  (1.727 m)     GENERAL: The patient is a well-nourished male, in no acute distress. The vital signs are documented above. CARDIAC: There is a regular rate and rhythm.  VASCULAR:  Right neck incision healed NEUROLOGIC: No focal weakness or paresthesias are detected.  Cranial nerves II through XII grossly intact.   DATA:   Carotid duplex 04/23/22 reviewed and right carotid endarterectomy site is widely patent by duplex. He has a 60 to 79% left  ICA stenosis by velocity criteria, stable over past 6 months.  Assessment/Plan:  77 year old male status post right carotid  endarterectomy on 02/22/2021 for an asymptomatic high-grade stenosis that presents for 6 month follow-up of his carotid artery disease.  His right carotid endarterectomy site is widely patent on duplex.  He continues to have a 60 to 79% stenosis left ICA stenosis with no progression to high grade disease over the past 6 months.  Again discussed in the absence of neurologic symptoms, we reserve surgical invention for greater than 80% stenosis.  I will see him again in 6 months with carotid duplex here in the office.  He is on aspirin and statin for risk reduction.  Marty Heck, MD [facility] Office: [phone]

## 2022-06-20 ENCOUNTER — Other Ambulatory Visit: Payer: Self-pay

## 2022-06-20 DIAGNOSIS — I6523 Occlusion and stenosis of bilateral carotid arteries: Secondary | ICD-10-CM

## 2022-08-14 ENCOUNTER — Other Ambulatory Visit: Payer: Self-pay | Admitting: Cardiology

## 2022-11-26 ENCOUNTER — Other Ambulatory Visit: Payer: Self-pay | Admitting: Cardiology

## 2022-12-05 ENCOUNTER — Encounter: Payer: Self-pay | Admitting: Cardiology

## 2022-12-05 ENCOUNTER — Ambulatory Visit: Payer: Medicare PPO | Attending: Cardiology | Admitting: Cardiology

## 2022-12-05 VITALS — BP 140/70 | HR 64 | Ht 68.0 in | Wt 248.0 lb

## 2022-12-05 DIAGNOSIS — I10 Essential (primary) hypertension: Secondary | ICD-10-CM | POA: Diagnosis not present

## 2022-12-05 DIAGNOSIS — I6523 Occlusion and stenosis of bilateral carotid arteries: Secondary | ICD-10-CM | POA: Diagnosis not present

## 2022-12-05 DIAGNOSIS — I2510 Atherosclerotic heart disease of native coronary artery without angina pectoris: Secondary | ICD-10-CM

## 2022-12-05 MED ORDER — AMLODIPINE BESYLATE 5 MG PO TABS: 5.0000 mg | ORAL_TABLET | Freq: Every day | ORAL | 3 refills | Status: DC

## 2022-12-05 MED ORDER — METOPROLOL SUCCINATE ER 25 MG PO TB24: 25.0000 mg | ORAL_TABLET | Freq: Every day | ORAL | 1 refills | Status: DC

## 2022-12-05 NOTE — Addendum Note (Signed)
Addended by: Baldo Ash D on: 12/05/2022 01:32 PM   Modules accepted: Orders

## 2022-12-05 NOTE — Patient Instructions (Signed)
Medication Instructions:   START: Amlodipine 5mg  1 tablet daily   Lab Work: Lipid, AST, ALT- today If you have labs (blood work) drawn today and your tests are completely normal, you will receive your results only by: MyChart Message (if you have MyChart) OR A paper copy in the mail If you have any lab test that is abnormal or we need to change your treatment, we will call you to review the results.   Testing/Procedures: None Ordered   Follow-Up: At [facility], you and your health needs are our priority.  As part of our continuing mission to provide you with exceptional heart care, we have created designated Provider Care Teams.  These Care Teams include your primary Cardiologist (physician) and Advanced Practice Providers (APPs -  Physician Assistants and Nurse Practitioners) who all work together to provide you with the care you need, when you need it.  We recommend signing up for the patient portal called "MyChart".  Sign up information is provided on this After Visit Summary.  MyChart is used to connect with patients for Virtual Visits (Telemedicine).  Patients are able to view lab/test results, encounter notes, upcoming appointments, etc.  Non-urgent messages can be sent to your provider as well.   To learn more about what you can do with MyChart, go to [email or web address].    Your next appointment:   6 month(s)  The format for your next appointment:   In Person  Provider:   Gypsy Balsam, MD    Other Instructions NA

## 2022-12-05 NOTE — Progress Notes (Signed)
Cardiology Office Note:    Date:  12/05/2022   ID:  Russell Butler, DOB 01-Sep-1945, MRN [id number]  PCP:  [place], Sarah T, MD  Cardiologist:  Gypsy Balsam, MD    Referring MD: [place], Sarah T, MD   Chief Complaint  Patient presents with   handicap sticker request    History of Present Illness:    Russell Butler is a 77 y.o. male with past medical history significant for carotic arterial disease status post carotic endarterectomy, coronary artery disease with cardiac catheterization done in September 2022 which showed 75% stenosis of the mid LAD with bridging, obtuse marginal 1 had 90% ostial stenosis obtuse marginal 2 as well as distal circumflex that also 90% stenosis however it was too small to intervene and decision at that time was to simply manage medically if unable to control symptoms then would consider bypass surgery. He comes today to months for follow-up overall doing very well.  Denies of any chest pain tightness squeezing pressure mentions just shortness of breath and fatigue not worse than compared to before.  Past Medical History:  Diagnosis Date   Asthma    Carotid bruit 12/28/2019   Chest pain of uncertain etiology 12/28/2019   Coronary artery disease 03/24/2020   Dyspnea    Essential hypertension    GERD (gastroesophageal reflux disease)    Heart murmur    Hyperlipidemia    Hypertension    Mixed hyperlipidemia    Peripheral vascular disease, unspecified (HCC) up to 79% p both sides carotids 02/24/2020   Snoring    SOB (shortness of breath)    Tinnitus     Past Surgical History:  Procedure Laterality Date   CARDIAC CATHETERIZATION     CORONARY PRESSURE/FFR STUDY N/A 01/05/2021   Procedure: INTRAVASCULAR PRESSURE WIRE/FFR STUDY;  Surgeon: Swaziland, Peter M, MD;  [facility];  Service: Cardiovascular;  Laterality: N/A;   ENDARTERECTOMY Right 02/22/2021   Procedure: RIGHT CAROTID ENDARTERECTOMY;  Surgeon: [name], MD;   Location: [facility] OR;  Service: Vascular;  Laterality: Right;   LEFT HEART CATH AND CORONARY ANGIOGRAPHY N/A 01/05/2021   Procedure: LEFT HEART CATH AND CORONARY ANGIOGRAPHY;  Surgeon: [place]azil[place], Peter M, MD;  [facility]: [facility] INVAS[facility] CV LAB;  Service: Cardiovascular;  Laterality: N/A;   NO PAST SURGERIES     PATCH ANGIOPLASTY Right 02/22/2021   Procedure: PATCH ANGIOPLASTY USING 1CM x 14CM XENOSURE BIOLOGIC PATCH;  Surgeon: [name], MD;  [facility];  Service: Vascular;  Laterality: Right;    Current Medications: Current Meds  Medication Sig   aspirin EC 81 MG tablet Take 81 mg by mouth daily. Swallow whole.   atorvastatin (LIPITOR) 80 MG tablet Take 1 tablet (80 mg total) by mouth daily.   Fluticasone-Salmeterol (WIXELA INHUB IN) Inhale 2 puffs into the lungs in the morning and at bedtime.   hydrochlorothiazide (HYDRODIURIL) 25 MG tablet Take 0.5 tablets (12.5 mg total) by mouth daily.   lisinopril (ZESTRIL) 40 MG tablet Take 40 mg by mouth daily.   metoprolol succinate (TOPROL-XL) 25 MG 24 hr tablet TAKE 1/2 TABLET BY MOUTH EVERY DAY     Allergies:   Patient has no known allergies.   Social History   Socioeconomic History   Marital status: Single    Spouse name: Not on file   Number of children: Not on file   Years of education: Not on file   Highest education level: Not on file  Occupational History   Not on  file  Tobacco Use   Smoking status: Former    Current packs/day: 0.00    Types: Cigarettes    Quit date: 12/28/1979    Years since quitting: 42.9   Smokeless tobacco: Never  Substance and Sexual Activity   Alcohol use: Yes    Alcohol/week: 10.0 standard drinks of alcohol    Types: 10 Glasses of wine per week   Drug use: Never   Sexual activity: Not on file  Other Topics Concern   Not on file  Social History Narrative   Not on file   Social Determinants of Health   Financial Resource Strain: Not on File (08/25/2021)   Received from [employer]    Financial Resource Strain: 0  Food Insecurity: Not on File (08/25/2021)   Received from [facility] Insecurity    Food: 0  Transportation Needs: Not on File (08/25/2021)   Received from [employer] Needs    Transportation: 0  Physical Activity: Not on File (08/25/2021)   Received from [facility]   Physical Activity    Physical Activity: 0  Stress: Not on File (08/25/2021)   Received from [facility]   Stress    Stress: 0  Social Connections: Not at Risk (08/25/2021)   Received from [place], [place]   Social Connections    Social Connections and Isolation: 1     Family History: The patient's family history includes Cancer in his paternal grandfather; Depression in his mother; Diabetes in his maternal grandfather; Hypercholesterolemia in his brother; Hypertension in his brother; Liver disease in his father. ROS:   Please see the history of present illness.    All 14 point review of systems negative except as described per history of present illness  EKGs/Labs/Other Studies Reviewed:         Recent Labs: No results found for requested labs within last 365 days.  Recent Lipid Panel    Component Value Date/Time   CHOL 118 09/28/2021 1106   TRIG 77 09/28/2021 1106   HDL 43 09/28/2021 1106   CHOLHDL 2.2 02/23/2021 0211   VLDL 11 02/23/2021 0211   LDLCALC 59 09/28/2021 1106    Physical Exam:    VS:  BP (!) 140/70 (BP Location: Left Arm, Patient Position: Sitting)   Pulse 64   Ht 5\' 8"  (1.727 m)   Wt 248 lb (112.5 kg)   SpO2 93%   BMI 37.71 kg/m     Wt Readings from Last 3 Encounters:  12/05/22 248 lb (112.5 kg)  06/19/22 245 lb (111.1 kg)  04/04/22 245 lb 6.4 oz (111.3 kg)     GEN:  Well nourished, well developed in no acute distress HEENT: Normal NECK: No JVD; No carotid bruits LYMPHATICS: No lymphadenopathy CARDIAC: RRR, no murmurs, no rubs, no gallops RESPIRATORY:  Clear to auscultation without rales, wheezing or rhonchi  ABDOMEN: Soft,  non-tender, non-distended MUSCULOSKELETAL:  No edema; No deformity  SKIN: Warm and dry LOWER EXTREMITIES: no swelling NEUROLOGIC:  Alert and oriented x 3 PSYCHIATRIC:  Normal affect   ASSESSMENT:    No diagnosis found. PLAN:    In order of problems listed above:  Coronary artery disease: Cardiac catheterization 2022 showed multiple lesions mid LAD however there was a bridging present therefore difficult to access, distal circumflex with obtuse marginal also had some stenosis.  Feeling was because of lack of symptoms we will manage medically he is doing quite well denies have any chest pain tightness squeezing  pressure burning chest, however described to have exertional shortness of breath not worse than before I am more about potentially this being an anginal equivalent.  He is able to take only very small dose of beta-blocker because of bradycardia, I will add amlodipine 5 to his medical regimen, continue antiplatelets therapy. Dyslipidemia I did review his K PN show me LDL 59 HDL 43 this is from May of last year we will recheck his fasting lipid profile. We talked about need to exercise on the regular basis.  He actually brought a disabled plaque for me to sign and I told him I will do it with some reservations and I told him I will do it only under condition that he started exercising on the regular basis meaning walking simply every day hopefully he will be able to do that   Medication Adjustments/Labs and Tests Ordered: Current medicines are reviewed at length with the patient today.  Concerns regarding medicines are outlined above.  No orders of the defined types were placed in this encounter.  Medication changes: No orders of the defined types were placed in this encounter.   Signed, Georgeanna Lea, MD, [facility] 12/05/2022 1:26 PM    [facility]

## 2022-12-25 ENCOUNTER — Encounter: Payer: Self-pay | Admitting: Vascular Surgery

## 2022-12-25 ENCOUNTER — Ambulatory Visit: Payer: Medicare PPO | Admitting: Vascular Surgery

## 2022-12-25 ENCOUNTER — Ambulatory Visit (HOSPITAL_COMMUNITY)
Admission: RE | Admit: 2022-12-25 | Discharge: 2022-12-25 | Disposition: A | Discharge status: Home / Self Care | Payer: Medicare PPO | Source: Ambulatory Visit | Attending: Vascular Surgery | Admitting: Vascular Surgery

## 2022-12-25 VITALS — BP 154/68 | HR 72 | Temp 98.0°F | Ht 68.0 in | Wt 253.0 lb

## 2022-12-25 DIAGNOSIS — I6523 Occlusion and stenosis of bilateral carotid arteries: Secondary | ICD-10-CM | POA: Insufficient documentation

## 2022-12-25 NOTE — Progress Notes (Signed)
Patient name: Russell Butler MRN: [id number] DOB: 08-10-1945 Sex: male  REASON FOR CONSULT: 47-month follow-up for carotid artery surveillance  HPI: Russell Butler is a 77 y.o. male, with history of hypertension, hyperlipidemia, coronary artery disease that presents for 6 month follow-up for carotid artery surveillance.  He had a right carotid endarterectomy on 02/22/2021 for an asymptomatic high-grade stenosis.  We are also following a moderate 60-79% left ICA stenosis.  He reports no issues over the last 6 months as it relates to stroke or TIA symptoms including vision loss or weakness.  Remains on aspirin and statin.  No new concerns today.   Past Medical History:  Diagnosis Date   Asthma    Carotid bruit 12/28/2019   Chest pain of uncertain etiology 12/28/2019   Coronary artery disease 03/24/2020   Dyspnea    Essential hypertension    GERD (gastroesophageal reflux disease)    Heart murmur    Hyperlipidemia    Hypertension    Mixed hyperlipidemia    Peripheral vascular disease, unspecified (HCC) up to 79% p both sides carotids 02/24/2020   Snoring    SOB (shortness of breath)    Tinnitus     Past Surgical History:  Procedure Laterality Date   CARDIAC CATHETERIZATION     CORONARY PRESSURE/FFR STUDY N/A 01/05/2021   Procedure: INTRAVASCULAR PRESSURE WIRE/FFR STUDY;  Surgeon: [place]azil[place], Peter M, MD;  [facility];  Service: Cardiovascular;  Laterality: N/A;   ENDARTERECTOMY Right 02/22/2021   Procedure: RIGHT CAROTID ENDARTERECTOMY;  Surgeon: [name], MD;  Location: [facility] OR;  Service: Vascular;  Laterality: Right;   LEFT HEART CATH AND CORONARY ANGIOGRAPHY N/A 01/05/2021   Procedure: LEFT HEART CATH AND CORONARY ANGIOGRAPHY;  Surgeon: [place]il[place], Peter M, MD;  Location: [facility] INVASIVE CV LAB;  Service: Cardiovascular;  Laterality: N/A;   NO PAST SURGERIES     PATCH ANGIOPLASTY Right 02/22/2021   Procedure: PATCH ANGIOPLASTY USING 1CM x 14CM XENOSURE BIOLOGIC  PATCH;  Surgeon: [name], MD;  [facility];  Service: Vascular;  Laterality: Right;    Family History  Problem Relation Age of Onset   Depression Mother    Liver disease Father    Hypertension Brother    Hypercholesterolemia Brother    Diabetes Maternal Grandfather    Cancer Paternal Grandfather     SOCIAL HISTORY: Social History   Socioeconomic History   Marital status: Single    Spouse name: Not on file   Number of children: Not on file   Years of education: Not on file   Highest education level: Not on file  Occupational History   Not on file  Tobacco Use   Smoking status: Former    Current packs/day: 0.00    Types: Cigarettes    Quit date: 12/28/1979    Years since quitting: 43.0   Smokeless tobacco: Never  Substance and Sexual Activity   Alcohol use: Yes    Alcohol/week: 10.0 standard drinks of alcohol    Types: 10 Glasses of wine per week   Drug use: Never   Sexual activity: Not on file  Other Topics Concern   Not on file  Social History Narrative   Not on file   Social Determinants of Health   Financial Resource Strain: Not on File (08/25/2021)   Received from [employer]    Financial Resource Strain: 0  Food Insecurity: Not on File (08/25/2021)   Received from [facility]  Insecurity    Food: 0  Transportation Needs: Not on File (08/25/2021)   Received from [employer] Needs    Transportation: 0  Physical Activity: Not on File (08/25/2021)   Received from [facility]   Physical Activity    Physical Activity: 0  Stress: Not on File (08/25/2021)   Received from [facility]   Stress    Stress: 0  Social Connections: Not at Risk (08/25/2021)   Received from [place], [place]   Social Connections    Social Connections and Isolation: 1  Intimate Partner Violence: Not on file    No Known Allergies  Current Outpatient Medications  Medication Sig Dispense Refill   amLODipine (NORVASC) 5 MG tablet Take 1 tablet (5 mg  total) by mouth daily. 90 tablet 3   aspirin EC 81 MG tablet Take 81 mg by mouth daily. Swallow whole.     atorvastatin (LIPITOR) 80 MG tablet Take 1 tablet (80 mg total) by mouth daily. 90 tablet 1   Fluticasone-Salmeterol (WIXELA INHUB IN) Inhale 2 puffs into the lungs in the morning and at bedtime.     hydrochlorothiazide (HYDRODIURIL) 25 MG tablet Take 0.5 tablets (12.5 mg total) by mouth daily. 45 tablet 3   lisinopril (ZESTRIL) 40 MG tablet Take 40 mg by mouth daily.     metoprolol succinate (TOPROL XL) 25 MG 24 hr tablet Take 1 tablet (25 mg total) by mouth daily. 45 tablet 1   metoprolol succinate (TOPROL-XL) 25 MG 24 hr tablet TAKE 1/2 TABLET BY MOUTH EVERY DAY 45 tablet 1   No current facility-administered medications for this visit.    REVIEW OF SYSTEMS:  [X]  denotes positive finding, [ ]  denotes negative finding Cardiac  Comments:  Chest pain or chest pressure:    Shortness of breath upon exertion:    Short of breath when lying flat:    Irregular heart rhythm:        Vascular    Pain in calf, thigh, or hip brought on by ambulation:    Pain in feet at night that wakes you up from your sleep:     Blood clot in your veins:    Leg swelling:         Pulmonary    Oxygen at home:    Productive cough:     Wheezing:         Neurologic    Sudden weakness in arms or legs:     Sudden numbness in arms or legs:     Sudden onset of difficulty speaking or slurred speech:    Temporary loss of vision in one eye:     Problems with dizziness:         Gastrointestinal    Blood in stool:     Vomited blood:         Genitourinary    Burning when urinating:     Blood in urine:        Psychiatric    Major depression:         Hematologic    Bleeding problems:    Problems with blood clotting too easily:        Skin    Rashes or ulcers:        Constitutional    Fever or chills:      PHYSICAL EXAM: Vitals:   12/25/22 1427 12/25/22 1429  BP: (!) 168/72 (!) 154/68  Pulse:  72   Temp: 98 F (36.7 C)   TempSrc: Temporal  SpO2: 95%   Weight: 253 lb (114.8 kg)   Height: 5\' 8"  (1.727 m)     GENERAL: The patient is a well-nourished male, in no acute distress. The vital signs are documented above. CARDIAC: There is a regular rate and rhythm.  PULM: No respiratory distress.  VASCULAR:  Right neck incision healed NEUROLOGIC: No focal weakness or paresthesias are detected.  Cranial nerves II through XII grossly intact.   DATA:   Carotid duplex today shows right carotid endarterectomy site is widely patent by duplex. He has a 60 to 79% left ICA stenosis by velocity criteria with no progression over past 6 months.  Assessment/Plan:  77 year old male status post right carotid endarterectomy on 02/22/2021 for an asymptomatic high-grade stenosis that presents for 6 month follow-up of his carotid artery disease.  His right carotid endarterectomy site is widely patent on duplex.  He continues to have a 60 to 79% stenosis left ICA stenosis with no progression to high grade disease over the past 6 months.  Again discussed in the absence of neurologic symptoms, we reserve surgical invention for greater than 80% stenosis.  I will see him again in 6 months with carotid duplex here in the office.  He is on aspirin and statin for risk reduction.  Discussed he call with questions or concerns.  [name], MD [facility] [place] Office: [phone]

## 2023-01-15 ENCOUNTER — Other Ambulatory Visit: Payer: Self-pay

## 2023-01-15 DIAGNOSIS — I6523 Occlusion and stenosis of bilateral carotid arteries: Secondary | ICD-10-CM

## 2023-02-05 ENCOUNTER — Other Ambulatory Visit: Payer: Self-pay | Admitting: Cardiology

## 2023-02-27 ENCOUNTER — Telehealth: Payer: Self-pay | Admitting: Cardiology

## 2023-02-27 NOTE — Telephone Encounter (Signed)
Patient called again to confirm the dosage for his metoprolol succinate (TOPROL-XL) 25 MG 24 hr tablet medication.

## 2023-02-27 NOTE — Telephone Encounter (Signed)
Spoke with pt and he will continue to take 1/2 12.5 mg tablet as Dr. Vanetta Shawl note states he added amlodipine since pt can only take a sm dose of beta blocker.

## 2023-02-27 NOTE — Telephone Encounter (Signed)
Pt c/o medication issue:  1. Name of Medication:   metoprolol succinate (TOPROL-XL) 25 MG 24 hr tablet   2. How are you currently taking this medication (dosage and times per day)?   3. Are you having a reaction (difficulty breathing--STAT)?   No  4. What is your medication issue?   Patient stated he had been taking a half tablet and when he picked up his prescription it showed he should take one whole tablet daily.  Patient wants call back to confirm the dosage for his medication.

## 2023-05-14 ENCOUNTER — Telehealth: Payer: Self-pay | Admitting: Cardiology

## 2023-05-14 DIAGNOSIS — I10 Essential (primary) hypertension: Secondary | ICD-10-CM

## 2023-05-14 MED ORDER — HYDROCHLOROTHIAZIDE 25 MG PO TABS
12.5000 mg | ORAL_TABLET | Freq: Every day | ORAL | 1 refills | Status: DC
Start: 2023-05-14 — End: 2023-11-04

## 2023-05-14 NOTE — Telephone Encounter (Signed)
 *  STAT* If patient is at the pharmacy, call can be transferred to refill team.   1. Which medications need to be refilled? (please list name of each medication and dose if known) hydrochlorothiazide  (HYDRODIURIL ) 25 MG tablet    2. Would you like to learn more about the convenience, safety, & potential cost savings by using the [facility]?    3. Are you open to using the [facility] (Type [facility].    4. Which pharmacy/location (including street and city if local pharmacy) is medication to be sent to?  [id number] - [place], [place] - [address]     5. Do they need a 30 day or 90 day supply? 90 days

## 2023-05-16 ENCOUNTER — Other Ambulatory Visit: Payer: Self-pay | Admitting: Cardiology

## 2023-05-21 DIAGNOSIS — Z6836 Body mass index (BMI) 36.0-36.9, adult: Secondary | ICD-10-CM | POA: Insufficient documentation

## 2023-07-02 ENCOUNTER — Ambulatory Visit (HOSPITAL_COMMUNITY)
Admission: RE | Admit: 2023-07-02 | Discharge: 2023-07-02 | Disposition: A | Discharge status: Home / Self Care | Payer: Medicare PPO | Source: Ambulatory Visit | Attending: Vascular Surgery | Admitting: Vascular Surgery

## 2023-07-02 ENCOUNTER — Ambulatory Visit: Payer: Medicare PPO | Admitting: Vascular Surgery

## 2023-07-02 ENCOUNTER — Encounter (HOSPITAL_COMMUNITY): Payer: Medicare PPO

## 2023-07-02 ENCOUNTER — Encounter: Payer: Self-pay | Admitting: Vascular Surgery

## 2023-07-02 VITALS — BP 118/69 | HR 65 | Temp 98.1°F | Resp 20 | Ht 68.0 in | Wt 253.4 lb

## 2023-07-02 DIAGNOSIS — I6523 Occlusion and stenosis of bilateral carotid arteries: Secondary | ICD-10-CM | POA: Diagnosis present

## 2023-07-02 NOTE — Progress Notes (Signed)
 Patient name: Russell Butler MRN: [id number] DOB: 1946-01-04 Sex: male  REASON FOR CONSULT: 86-month follow-up for carotid artery surveillance  HPI: Russell Butler is a 78 y.o. male, with history of hypertension, hyperlipidemia, coronary artery disease that presents for 6 month follow-up for carotid artery surveillance.  He had a right carotid endarterectomy on 02/22/2021 for an asymptomatic high-grade stenosis.  We are also following a moderate 60-79% left ICA stenosis.  He reports no issues over the last 6 months as it relates to stroke or TIA symptoms including vision loss or weakness.  Remains on aspirin and statin.  No new concerns today.  Overall doing well.   Past Medical History:  Diagnosis Date   Asthma    Carotid artery occlusion    Carotid bruit 12/28/2019   Chest pain of uncertain etiology 12/28/2019   Coronary artery disease 03/24/2020   Dyspnea    Essential hypertension    GERD (gastroesophageal reflux disease)    Heart murmur    Hyperlipidemia    Hypertension    Mixed hyperlipidemia    Peripheral vascular disease, unspecified (HCC) up to 79% p both sides carotids 02/24/2020   Snoring    SOB (shortness of breath)    Tinnitus     Past Surgical History:  Procedure Laterality Date   CARDIAC CATHETERIZATION     CORONARY PRESSURE/FFR STUDY N/A 01/05/2021   Procedure: INTRAVASCULAR PRESSURE WIRE/FFR STUDY;  Surgeon: Swaziland, Peter M, MD;  [facility]: [facility];  Service: Cardiovascular;  Laterality: N/A;   ENDARTERECTOMY Right 02/22/2021   Procedure: RIGHT CAROTID ENDARTERECTOMY;  Surgeon: [name], MD;  Location: [facility] OR;  Service: Vascular;  Laterality: Right;   LEFT HEART CATH AND CORONARY ANGIOGRAPHY N/A 01/05/2021   Procedure: LEFT HEART CATH AND CORONARY ANGIOGRAPHY;  Surgeon: Swaziland, Peter M, MD;  Location: [facility] INVASIVE CV LAB;  Service: Cardiovascular;  Laterality: N/A;   NO PAST SURGERIES     PATCH ANGIOPLASTY Right 02/22/2021   Procedure:  PATCH ANGIOPLASTY USING 1CM x 14CM XENOSURE BIOLOGIC PATCH;  Surgeon: [name], MD;  Location: [facility] OR;  Service: Vascular;  Laterality: Right;    Family History  Problem Relation Age of Onset   Depression Mother    Liver disease Father    Hypertension Brother    Hypercholesterolemia Brother    Diabetes Maternal Grandfather    Cancer Paternal Grandfather     SOCIAL HISTORY: Social History   Socioeconomic History   Marital status: Single    Spouse name: Not on file   Number of children: Not on file   Years of education: Not on file   Highest education level: Not on file  Occupational History   Not on file  Tobacco Use   Smoking status: Former    Current packs/day: 0.00    Types: Cigarettes    Quit date: 12/28/1979    Years since quitting: 43.5   Smokeless tobacco: Never  Vaping Use   Vaping status: Never Used  Substance and Sexual Activity   Alcohol use: Yes    Alcohol/week: 10.0 standard drinks of alcohol    Types: 10 Glasses of wine per week   Drug use: Never   Sexual activity: Not on file  Other Topics Concern   Not on file  Social History Narrative   Not on file   Social Drivers of Health   Financial Resource Strain: Not at Risk (05/21/2023)   Received from [employer]  Financial Resource Strain: 1  Food Insecurity: Not at Risk (05/21/2023)   Received from [employer]    Food: 1  Transportation Needs: Not at Risk (05/21/2023)   Received from [employer] Needs    Transportation: 1  Physical Activity: Not on File (08/25/2021)   Received from [facility]   Physical Activity    Physical Activity: 0  Stress: Not on File (08/25/2021)   Received from [facility]   Stress    Stress: 0  Social Connections: Not at Risk (05/21/2023)   Received from [employer]   Social Connections    Connectedness: 1  Intimate Partner Violence: Not on file    No Known Allergies  Current Outpatient Medications  Medication Sig Dispense  Refill   amLODipine (NORVASC) 5 MG tablet Take 1 tablet (5 mg total) by mouth daily. 90 tablet 3   aspirin EC 81 MG tablet Take 81 mg by mouth daily. Swallow whole.     atorvastatin (LIPITOR) 80 MG tablet Take 1 tablet (80 mg total) by mouth daily. 90 tablet 2   Fluticasone-Salmeterol (WIXELA INHUB IN) Inhale 2 puffs into the lungs in the morning and at bedtime.     hydrochlorothiazide (HYDRODIURIL) 25 MG tablet Take 0.5 tablets (12.5 mg total) by mouth daily. 45 tablet 1   lisinopril (ZESTRIL) 40 MG tablet Take 40 mg by mouth daily.     metoprolol succinate (TOPROL-XL) 25 MG 24 hr tablet TAKE 1/2 TABLET BY MOUTH EVERY DAY 45 tablet 1   No current facility-administered medications for this visit.    REVIEW OF SYSTEMS:  [X]  denotes positive finding, [ ]  denotes negative finding Cardiac  Comments:  Chest pain or chest pressure:    Shortness of breath upon exertion:    Short of breath when lying flat:    Irregular heart rhythm:        Vascular    Pain in calf, thigh, or hip brought on by ambulation:    Pain in feet at night that wakes you up from your sleep:     Blood clot in your veins:    Leg swelling:         Pulmonary    Oxygen at home:    Productive cough:     Wheezing:         Neurologic    Sudden weakness in arms or legs:     Sudden numbness in arms or legs:     Sudden onset of difficulty speaking or slurred speech:    Temporary loss of vision in one eye:     Problems with dizziness:         Gastrointestinal    Blood in stool:     Vomited blood:         Genitourinary    Burning when urinating:     Blood in urine:        Psychiatric    Major depression:         Hematologic    Bleeding problems:    Problems with blood clotting too easily:        Skin    Rashes or ulcers:        Constitutional    Fever or chills:      PHYSICAL EXAM: Vitals:   07/02/23 1420 07/02/23 1421  BP: 110/68 118/69  Pulse: 65   Resp: 20   Temp: 98.1 F (36.7 C)   TempSrc:  Temporal   SpO2: 97%   Weight:  253 lb 6.4 oz (114.9 kg)   Height: 5\' 8"  (1.727 m)     GENERAL: The patient is a well-nourished male, in no acute distress. The vital signs are documented above. CARDIAC: There is a regular rate and rhythm.  PULM: No respiratory distress.  VASCULAR:  Right neck incision healed NEUROLOGIC: No focal weakness or paresthesias are detected.  Cranial nerves II through XII grossly intact.   DATA:   Carotid duplex today shows right carotid endarterectomy site is widely patent by duplex. He has a 60 to 79% left ICA stenosis by velocity criteria with no progression over past 6 months.  Assessment/Plan:  78 year old male status post right carotid endarterectomy on 02/22/2021 for an asymptomatic high-grade stenosis that presents for 6 month follow-up of his carotid artery disease.  His right carotid endarterectomy site is widely patent on duplex.  He continues to have a 60 to 79% stenosis left ICA stenosis with no progression to high grade disease over the past 6 months.  Discussed for asymptomatic carotid disease we recommend surgical revascularization when it is greater than 80%.  Given his stable 60 to 79% left ICA stenosis will repeat carotid ultrasound in 6 months and no indication for surgery at this time.  He is on aspirin statin for risk reduction.  Call with questions or concerns.    [name], MD [facility] [place] Office: [phone]   [name]

## 2023-07-25 ENCOUNTER — Encounter: Payer: Self-pay | Admitting: Cardiology

## 2023-07-25 ENCOUNTER — Ambulatory Visit: Payer: Medicare PPO | Attending: Cardiology | Admitting: Cardiology

## 2023-07-25 VITALS — BP 130/70 | HR 69 | Ht 68.0 in | Wt 249.6 lb

## 2023-07-25 DIAGNOSIS — R0609 Other forms of dyspnea: Secondary | ICD-10-CM

## 2023-07-25 DIAGNOSIS — I6523 Occlusion and stenosis of bilateral carotid arteries: Secondary | ICD-10-CM

## 2023-07-25 DIAGNOSIS — I10 Essential (primary) hypertension: Secondary | ICD-10-CM

## 2023-07-25 NOTE — Patient Instructions (Signed)

## 2023-07-25 NOTE — Progress Notes (Unsigned)
 Cardiology Office Note:    Date:  07/25/2023   ID:  Russell Butler, DOB 10-01-1945, MRN [id number]  PCP:  Swaziland, Sarah T, MD  Cardiologist:  Gypsy Balsam, MD    Referring MD: Swazil[place], Sarah T, MD   Chief Complaint  Patient presents with   Medication Refill    Metoprolol succ    History of Present Illness:    Russell Butler is a 78 y.o. male  with past medical history significant for carotic arterial disease status post carotic endarterectomy, coronary artery disease with cardiac catheterization done in September 2022 which showed 75% stenosis of the mid LAD with bridging, obtuse marginal 1 had 90% ostial stenosis obtuse marginal 2 as well as distal circumflex that also 90% stenosis however it was too small to intervene and decision at that time was to simply manage medically if unable to control symptoms then would consider bypass surgery.  Comes today to months for follow-up overall he is doing very well.  He does have multiple arthritic pain in his joints so his ability to exercise is limited he denies have any chest pain, tightness, pressure, burning in the chest.  Past Medical History:  Diagnosis Date   Asthma    Carotid artery occlusion    Carotid bruit 12/28/2019   Chest pain of uncertain etiology 12/28/2019   Coronary artery disease 03/24/2020   Dyspnea    Essential hypertension    GERD (gastroesophageal reflux disease)    Heart murmur    Hyperlipidemia    Hypertension    Mixed hyperlipidemia    Peripheral vascular disease, unspecified (HCC) up to 79% p both sides carotids 02/24/2020   Snoring    SOB (shortness of breath)    Tinnitus     Past Surgical History:  Procedure Laterality Date   CARDIAC CATHETERIZATION     CORONARY PRESSURE/FFR STUDY N/A 01/05/2021   Procedure: INTRAVASCULAR PRESSURE WIRE/FFR STUDY;  Surgeon: Swaziland, Peter M, MD;  Location: [facility]VAS[facility] CV LAB;  Service: Cardiovascular;  Laterality: N/A;   ENDARTERECTOMY Right 02/22/2021   Procedure:  RIGHT CAROTID ENDARTERECTOMY;  Surgeon: [name], MD;  Location: [facility] OR;  Service: Vascular;  Laterality: Right;   LEFT HEART CATH AND CORONARY ANGIOGRAPHY N/A 01/05/2021   Procedure: LEFT HEART CATH AND CORONARY ANGIOGRAPHY;  Surgeon: Swaziland, Peter M, MD;  Location: [facility] INVASIVE CV LAB;  Service: Cardiovascular;  Laterality: N/A;   NO PAST SURGERIES     PATCH ANGIOPLASTY Right 02/22/2021   Procedure: PATCH ANGIOPLASTY USING 1CM x 14CM XENOSURE BIOLOGIC PATCH;  Surgeon: [name], MD;  Location: [facility] OR;  Service: Vascular;  Laterality: Right;    Current Medications: Current Meds  Medication Sig   amLODipine (NORVASC) 5 MG tablet Take 1 tablet (5 mg total) by mouth daily.   aspirin EC 81 MG tablet Take 81 mg by mouth daily. Swallow whole.   atorvastatin (LIPITOR) 80 MG tablet Take 1 tablet (80 mg total) by mouth daily.   Fluticasone-Salmeterol (WIXELA INHUB IN) Inhale 2 puffs into the lungs in the morning and at bedtime.   hydrochlorothiazide (HYDRODIURIL) 25 MG tablet Take 0.5 tablets (12.5 mg total) by mouth daily.   lisinopril (ZESTRIL) 40 MG tablet Take 40 mg by mouth daily.   metoprolol succinate (TOPROL-XL) 25 MG 24 hr tablet TAKE 1/2 TABLET BY MOUTH EVERY DAY     Allergies:   Patient has no known allergies.   Social History   Socioeconomic History   Marital status: Single  Spouse name: Not on file   Number of children: Not on file   Years of education: Not on file   Highest education level: Not on file  Occupational History   Not on file  Tobacco Use   Smoking status: Former    Current packs/day: 0.00    Types: Cigarettes    Quit date: 12/28/1979    Years since quitting: 43.6   Smokeless tobacco: Never  Vaping Use   Vaping status: Never Used  Substance and Sexual Activity   Alcohol use: Yes    Alcohol/week: 10.0 standard drinks of alcohol    Types: 10 Glasses of wine per week   Drug use: Never   Sexual activity: Not on file  Other Topics  Concern   Not on file  Social History Narrative   Not on file   Social Drivers of Health   Financial Resource Strain: Not at Risk (05/21/2023)   Received from [employer]    Financial Resource Strain: 1  Food Insecurity: Not at Risk (05/21/2023)   Received from [facility] Insecurity    Food: 1  Transportation Needs: Not at Risk (05/21/2023)   Received from [employer] Needs    Transportation: 1  Physical Activity: Not on File (08/25/2021)   Received from [facility]   Physical Activity    Physical Activity: 0  Stress: Not on File (08/25/2021)   Received from [facility]   Stress    Stress: 0  Social Connections: Not at Risk (05/21/2023)   Received from [employer]   Social Connections    Connectedness: 1     Family History: The patient's family history includes Cancer in his paternal grandfather; Depression in his mother; Diabetes in his maternal grandfather; Hypercholesterolemia in his brother; Hypertension in his brother; Liver disease in his father. ROS:   Please see the history of present illness.    All 14 point review of systems negative except as described per history of present illness  EKGs/Labs/Other Studies Reviewed:         Recent Labs: 12/05/2022: ALT 25  Recent Lipid Panel    Component Value Date/Time   CHOL 138 12/05/2022 1417   TRIG 112 12/05/2022 1417   HDL 50 12/05/2022 1417   CHOLHDL 2.8 12/05/2022 1417   CHOLHDL 2.2 02/23/2021 0211   VLDL 11 02/23/2021 0211   LDLCALC 68 12/05/2022 1417    Physical Exam:    VS:  BP 130/70 (BP Location: Right Arm, Patient Position: Sitting)   Pulse 69   Ht 5\' 8"  (1.727 m)   Wt 249 lb 9.6 oz (113.2 kg)   SpO2 93%   BMI 37.95 kg/m     Wt Readings from Last 3 Encounters:  07/25/23 249 lb 9.6 oz (113.2 kg)  07/02/23 253 lb 6.4 oz (114.9 kg)  12/25/22 253 lb (114.8 kg)     GEN:  Well nourished, well developed in no acute distress HEENT: Normal NECK: No JVD; No carotid  bruits LYMPHATICS: No lymphadenopathy CARDIAC: RRR, soft systolic murmur grade 1/6 to 2/6 pressure right upper portion of the sternum, no rubs, no gallops RESPIRATORY:  Clear to auscultation without rales, wheezing or rhonchi  ABDOMEN: Soft, non-tender, non-distended MUSCULOSKELETAL:  No edema; No deformity  SKIN: Warm and dry LOWER EXTREMITIES: no swelling NEUROLOGIC:  Alert and oriented x 3 PSYCHIATRIC:  Normal affect   ASSESSMENT:    1. Essential hypertension   2. Bilateral carotid artery stenosis   3.  Dyspnea on exertion    PLAN:    In order of problems listed above:  Coronary disease stable from that point is on appropriate medication denies have any signs or symptoms that would indicate reactivation of the problem continue monitoring Bilateral carotic artery stenosis follow-up by surgeon.  Stable Dyspnea exertion denies having any but again his ability to exercise is limited. Dyslipidemia did review K PN LDL 68 HDL 50 on appropriate medications which I will continue   Medication Adjustments/Labs and Tests Ordered: Current medicines are reviewed at length with the patient today.  Concerns regarding medicines are outlined above.  Orders Placed This Encounter  Procedures   EKG 12-Lead   Medication changes: No orders of the defined types were placed in this encounter.   Signed, Georgeanna Lea, MD, [facility] 07/25/2023 4:24 PM    [facility]

## 2023-08-10 ENCOUNTER — Other Ambulatory Visit: Payer: Self-pay | Admitting: Cardiology

## 2023-08-20 DIAGNOSIS — J4520 Mild intermittent asthma, uncomplicated: Secondary | ICD-10-CM | POA: Insufficient documentation

## 2023-08-20 DIAGNOSIS — J309 Allergic rhinitis, unspecified: Secondary | ICD-10-CM | POA: Insufficient documentation

## 2023-11-03 ENCOUNTER — Other Ambulatory Visit: Payer: Self-pay | Admitting: Cardiology

## 2023-11-03 DIAGNOSIS — I10 Essential (primary) hypertension: Secondary | ICD-10-CM

## 2023-11-04 ENCOUNTER — Other Ambulatory Visit: Payer: Self-pay | Admitting: Cardiology

## 2023-11-04 NOTE — Telephone Encounter (Signed)
 Rx refill sent to pharmacy.

## 2023-11-22 ENCOUNTER — Other Ambulatory Visit: Payer: Self-pay | Admitting: Cardiology

## 2023-12-05 ENCOUNTER — Other Ambulatory Visit: Payer: Self-pay | Admitting: Vascular Surgery

## 2023-12-05 DIAGNOSIS — I6523 Occlusion and stenosis of bilateral carotid arteries: Secondary | ICD-10-CM

## 2023-12-31 ENCOUNTER — Encounter: Payer: Self-pay | Admitting: Vascular Surgery

## 2023-12-31 ENCOUNTER — Ambulatory Visit: Payer: Medicare PPO | Attending: Vascular Surgery | Admitting: Vascular Surgery

## 2023-12-31 ENCOUNTER — Ambulatory Visit (HOSPITAL_COMMUNITY)
Admission: RE | Admit: 2023-12-31 | Discharge: 2023-12-31 | Disposition: A | Discharge status: Home / Self Care | Payer: Medicare PPO | Source: Ambulatory Visit | Attending: Vascular Surgery | Admitting: Vascular Surgery

## 2023-12-31 VITALS — BP 134/63 | HR 62 | Temp 98.0°F | Resp 22 | Ht 68.0 in | Wt 255.9 lb

## 2023-12-31 DIAGNOSIS — I6523 Occlusion and stenosis of bilateral carotid arteries: Secondary | ICD-10-CM | POA: Insufficient documentation

## 2023-12-31 DIAGNOSIS — I6521 Occlusion and stenosis of right carotid artery: Secondary | ICD-10-CM | POA: Diagnosis not present

## 2023-12-31 NOTE — Progress Notes (Signed)
 Patient name: Russell Butler MRN: [id number] DOB: November 03, 1945 Sex: male  REASON FOR CONSULT: 83-month follow-up for carotid artery surveillance  HPI: Russell Butler is a 78 y.o. male, with history of hypertension, hyperlipidemia, coronary artery disease that presents for 6 month follow-up for carotid artery surveillance.  He had a right carotid endarterectomy on 02/22/2021 for an asymptomatic high-grade stenosis.  We are also following a moderate 60-79% left ICA stenosis.  He reports no issues over the last 6 months as it relates to stroke or TIA symptoms including vision loss or weakness.  Remains on aspirin  and statin.  No new concerns today.  Overall doing well.  Is enjoying retirement.   Past Medical History:  Diagnosis Date   Allergy    Asthma    Carotid artery occlusion    Carotid bruit 12/28/2019   Chest pain of uncertain etiology 12/28/2019   Coronary artery disease 03/24/2020   Dyspnea    Essential hypertension    GERD (gastroesophageal reflux disease)    Heart murmur    Hyperlipidemia    Hypertension    Mixed hyperlipidemia    Peripheral vascular disease, unspecified (HCC) up to 79% p both sides carotids 02/24/2020   Snoring    SOB (shortness of breath)    Tinnitus     Past Surgical History:  Procedure Laterality Date   CARDIAC CATHETERIZATION     CORONARY PRESSURE/FFR STUDY N/A 01/05/2021   Procedure: INTRAVASCULAR PRESSURE WIRE/FFR STUDY;  Surgeon: Swaziland, Peter M, MD;  Location: [facility] INVASIVE CV LAB;  Service: Cardiovascular;  Laterality: N/A;   ENDARTERECTOMY Right 02/22/2021   Procedure: RIGHT CAROTID ENDARTERECTOMY;  Surgeon: Gretta Lonni PARAS, MD;  Location: [facility] OR;  Service: Vascular;  Laterality: Right;   LEFT HEART CATH AND CORONARY ANGIOGRAPHY N/A 01/05/2021   Procedure: LEFT HEART CATH AND CORONARY ANGIOGRAPHY;  Surgeon: Swaziland, Peter M, MD;  Location: [facility] INVASIVE CV LAB;  Service: Cardiovascular;  Laterality: N/A;   NO PAST SURGERIES     PATCH  ANGIOPLASTY Right 02/22/2021   Procedure: PATCH ANGIOPLASTY USING 1CM x 14CM XENOSURE BIOLOGIC PATCH;  Surgeon: Gretta Lonni PARAS, MD;  Location: [facility] OR;  Service: Vascular;  Laterality: Right;    Family History  Problem Relation Age of Onset   Depression Mother    Liver disease Father    Hypertension Brother    Hypercholesterolemia Brother    Diabetes Maternal Grandfather    Cancer Paternal Grandfather     SOCIAL HISTORY: Social History   Socioeconomic History   Marital status: Single    Spouse name: Not on file   Number of children: Not on file   Years of education: Not on file   Highest education level: Not on file  Occupational History   Not on file  Tobacco Use   Smoking status: Former    Current packs/day: 0.00    Types: Cigarettes    Quit date: 12/28/1979    Years since quitting: 44.0   Smokeless tobacco: Never  Vaping Use   Vaping status: Never Used  Substance and Sexual Activity   Alcohol use: Yes    Alcohol/week: 10.0 standard drinks of alcohol    Types: 10 Glasses of wine per week   Drug use: Never   Sexual activity: Not on file  Other Topics Concern   Not on file  Social History Narrative   Not on file   Social Drivers of Health   Financial Resource Strain: Not at Risk (05/21/2023)   Received from [facility]  Financial Resource Strain    How hard is it for you to pay for the very basics like food, housing, heating, medical care, and medications?: 1  Food Insecurity: Not at Risk (05/21/2023)   Received from [employer] Insecurity    Within the past 12 months, you worried that your food would run out before you got money to buy more.: 1  Transportation Needs: Not at Risk (05/21/2023)   Received from [facility] Needs    In the past 12 months, has lack of transportation kept you from medical appointments, meetings, work or from getting things needed for daily living?: 1  Physical Activity: Not on File (08/25/2021)   Received from [facility]    Physical Activity    Physical Activity: 0  Stress: Not on File (08/25/2021)   Received from [facility]   Stress    Stress: 0  Social Connections: Not at Risk (05/21/2023)   Received from [facility]   Social Connections    How often do you feel lonely or isolated from those around you? : 1  Intimate Partner Violence: Not on file    No Known Allergies  Current Outpatient Medications  Medication Sig Dispense Refill   amLODipine  (NORVASC ) 5 MG tablet TAKE 1 TABLET (5 MG TOTAL) BY MOUTH DAILY. 90 tablet 2   aspirin  EC 81 MG tablet Take 81 mg by mouth daily. Swallow whole.     atorvastatin  (LIPITOR ) 80 MG tablet TAKE 1 TABLET BY MOUTH EVERY DAY 90 tablet 2   Fluticasone -Salmeterol (WIXELA INHUB IN) Inhale 2 puffs into the lungs in the morning and at bedtime.     hydrochlorothiazide  (HYDRODIURIL ) 12.5 MG tablet Take 12.5 mg by mouth.     lisinopril  (ZESTRIL ) 40 MG tablet Take 40 mg by mouth daily.     metoprolol  succinate (TOPROL -XL) 25 MG 24 hr tablet TAKE 1/2 TABLET BY MOUTH EVERY DAY 45 tablet 1   montelukast (SINGULAIR) 10 MG tablet Take 10 mg by mouth at bedtime.     No current facility-administered medications for this visit.    REVIEW OF SYSTEMS:  [X]  denotes positive finding, [ ]  denotes negative finding Cardiac  Comments:  Chest pain or chest pressure:    Shortness of breath upon exertion:    Short of breath when lying flat:    Irregular heart rhythm:        Vascular    Pain in calf, thigh, or hip brought on by ambulation:    Pain in feet at night that wakes you up from your sleep:     Blood clot in your veins:    Leg swelling:         Pulmonary    Oxygen at home:    Productive cough:     Wheezing:         Neurologic    Sudden weakness in arms or legs:     Sudden numbness in arms or legs:     Sudden onset of difficulty speaking or slurred speech:    Temporary loss of vision in one eye:     Problems with dizziness:         Gastrointestinal    Blood in stool:     Vomited  blood:         Genitourinary    Burning when urinating:     Blood in urine:        Psychiatric    Major depression:  Hematologic    Bleeding problems:    Problems with blood clotting too easily:        Skin    Rashes or ulcers:        Constitutional    Fever or chills:      PHYSICAL EXAM: Vitals:   12/31/23 1425 12/31/23 1428  BP: (!) 121/57 134/63  Pulse: 62   Resp: (!) 22   Temp: 98 F (36.7 C)   TempSrc: Temporal   SpO2: 99%   Weight: 255 lb 14.4 oz (116.1 kg)   Height: 5' 8 (1.727 m)     GENERAL: The patient is a well-nourished male, in no acute distress. The vital signs are documented above. CARDIAC: There is a regular rate and rhythm.  PULM: No respiratory distress.  VASCULAR:  Right neck incision healed NEUROLOGIC: No focal weakness or paresthesias are detected.  Cranial nerves II through XII grossly intact.   DATA:   Carotid duplex today shows patent right carotid endarterectomy site with no recurrent stenosis. He has a 60 to 79% left ICA stenosis by velocity criteria - no change over the past 6 months.  Assessment/Plan:  78 year old male status post right carotid endarterectomy on 02/22/2021 for an asymptomatic high-grade stenosis that presents for 6 month follow-up of his carotid artery disease.  His right carotid endarterectomy site remains widely patent on duplex.  He continues to have a 60 to 79% stenosis left ICA stenosis with no progression to high grade disease over the past 6 months.  Discussed for asymptomatic carotid disease we recommend surgical revascularization when it is greater than 80%.  Given his stable 60 to 79% left ICA stenosis will repeat carotid ultrasound in 6 months and no indication for surgery at this time.  He is on aspirin  statin for risk reduction.  Call with questions or concerns.    Lonni DOROTHA Gaskins, MD [facility] [place] Office: [phone]   Lonni JINNY Gaskins

## 2024-01-01 ENCOUNTER — Other Ambulatory Visit: Payer: Self-pay

## 2024-01-01 DIAGNOSIS — I6523 Occlusion and stenosis of bilateral carotid arteries: Secondary | ICD-10-CM

## 2024-01-17 ENCOUNTER — Other Ambulatory Visit: Payer: Self-pay | Admitting: Cardiology

## 2024-03-19 ENCOUNTER — Ambulatory Visit: Attending: Cardiology | Admitting: Cardiology

## 2024-03-19 ENCOUNTER — Encounter: Payer: Self-pay | Admitting: Cardiology

## 2024-03-19 VITALS — BP 120/70 | HR 71 | Ht 68.0 in | Wt 255.0 lb

## 2024-03-19 DIAGNOSIS — I6523 Occlusion and stenosis of bilateral carotid arteries: Secondary | ICD-10-CM | POA: Diagnosis not present

## 2024-03-19 DIAGNOSIS — E782 Mixed hyperlipidemia: Secondary | ICD-10-CM | POA: Diagnosis not present

## 2024-03-19 DIAGNOSIS — I2510 Atherosclerotic heart disease of native coronary artery without angina pectoris: Secondary | ICD-10-CM

## 2024-03-19 DIAGNOSIS — I10 Essential (primary) hypertension: Secondary | ICD-10-CM

## 2024-03-19 MED ORDER — FUROSEMIDE 20 MG PO TABS: 20.0000 mg | ORAL_TABLET | Freq: Every day | ORAL | 3 refills | Status: AC

## 2024-03-19 NOTE — Patient Instructions (Addendum)
 Medication Instructions:   START: Lasix 20mg  1 tablet daily  STOP: hydrochlorothiazide     Lab Work: Your physician recommends that you return for lab work in: next week You need to have labs done when you are fasting.  You can come Monday through Friday 8:30 am to 12:00 pm and 1:15 to 4:30. You do not need to make an appointment as the order has already been placed.     Testing/Procedures: None Ordered   Follow-Up: At [facility], you and your health needs are our priority.  As part of our continuing mission to provide you with exceptional heart care, we have created designated Provider Care Teams.  These Care Teams include your primary Cardiologist (physician) and Advanced Practice Providers (APPs -  Physician Assistants and Nurse Practitioners) who all work together to provide you with the care you need, when you need it.  We recommend signing up for the patient portal called MyChart.  Sign up information is provided on this After Visit Summary.  MyChart is used to connect with patients for Virtual Visits (Telemedicine).  Patients are able to view lab/test results, encounter notes, upcoming appointments, etc.  Non-urgent messages can be sent to your provider as well.   To learn more about what you can do with MyChart, go to [email or web address].    Your next appointment:   6 month(s)  The format for your next appointment:   In Person  Provider:   Lamar Fitch, MD    Other Instructions NA

## 2024-03-19 NOTE — Addendum Note (Signed)
 Addended by: ARLOA PLANAS D on: 03/19/2024 04:13 PM   Modules accepted: Orders

## 2024-03-19 NOTE — Progress Notes (Signed)
 Cardiology Office Note:    Date:  03/19/2024   ID:  Russell Butler, DOB [birth year], MRN [id number]  PCP:  Sharyne Harlene CROME, NP  Cardiologist:  Lamar Fitch, MD    Referring MD: Jordan, Sarah T, MD   Chief Complaint  Patient presents with   Follow-up    History of Present Illness:    Russell Butler is a 78 y.o. 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  Comes today to months for follow-up he is doing great he is asymptomatic, he can walk climb stairs with no difficulties denies having any chest pain tightness squeezing pressure burning chest but admits also that he does not do much exercises.  Feels freezing and spends a lot of time reading.  No TIA CVA-like symptoms  Past Medical History:  Diagnosis Date   Allergy    Asthma    Carotid artery occlusion    Carotid bruit 12/28/2019   Chest pain of uncertain etiology 12/28/2019   Coronary artery disease 03/24/2020   Dyspnea    Essential hypertension    GERD (gastroesophageal reflux disease)    Heart murmur    Hyperlipidemia    Hypertension    Mixed hyperlipidemia    Peripheral vascular disease, unspecified (HCC) up to 79% p both sides carotids 02/24/2020   Snoring    SOB (shortness of breath)    Tinnitus     Past Surgical History:  Procedure Laterality Date   CARDIAC CATHETERIZATION     CORONARY PRESSURE/FFR STUDY N/A 01/05/2021   Procedure: INTRAVASCULAR PRESSURE WIRE/FFR STUDY;  Surgeon: Jordan, Peter M, MD;  Location: [facility] INVASIVE CV LAB;  Service:  Cardiovascular;  Laterality: N/A;   ENDARTERECTOMY Right 02/22/2021   Procedure: RIGHT CAROTID ENDARTERECTOMY;  Surgeon: Gretta Lonni PARAS, MD;  Location: [facility] OR;  Service: Vascular;  Laterality: Right;   LEFT HEART CATH AND CORONARY ANGIOGRAPHY N/A 01/05/2021   Procedure: LEFT HEART CATH AND CORONARY ANGIOGRAPHY;  Surgeon: Jordan, Peter M, MD;  Location: [facility] INVASIVE CV LAB;  Service: Cardiovascular;  Laterality: N/A;   NO PAST SURGERIES     PATCH ANGIOPLASTY Right 02/22/2021   Procedure: PATCH ANGIOPLASTY USING 1CM x 14CM XENOSURE BIOLOGIC PATCH;  Surgeon: Gretta Lonni PARAS, MD;  Location: [facility] OR;  Service: Vascular;  Laterality: Right;    Current Medications: Current Meds  Medication Sig   amLODipine  (NORVASC ) 5 MG tablet TAKE 1 TABLET (5 MG TOTAL) BY MOUTH DAILY.   aspirin  EC 81 MG tablet Take 81 mg by mouth daily. Swallow whole.   atorvastatin  (LIPITOR ) 80 MG tablet TAKE 1 TABLET BY MOUTH EVERY DAY   fluticasone -salmeterol (ADVAIR) 100-50 MCG/ACT AEPB Inhale 1 puff into the lungs 2 (two) times daily.   hydrochlorothiazide  (HYDRODIURIL ) 25 MG tablet Take 12.5 mg by mouth every other day.   lisinopril  (ZESTRIL ) 40 MG tablet Take 40 mg by mouth daily.   metoprolol  succinate (TOPROL -XL) 25 MG 24 hr tablet TAKE 1/2 TABLET BY MOUTH EVERY DAY   montelukast (SINGULAIR) 10 MG tablet Take 10 mg by mouth at bedtime.  Allergies:   Patient has no known allergies.   Social History   Socioeconomic History   Marital status: Single    Spouse name: Not on file   Number of children: Not on file   Years of education: Not on file   Highest education level: Not on file  Occupational History   Not on file  Tobacco Use   Smoking status: Former    Current packs/day: 0.00    Types: Cigarettes    Quit date: 12/28/1979    Years since quitting: 44.2   Smokeless tobacco: Never  Vaping Use   Vaping status: Never Used  Substance and Sexual Activity   Alcohol use: Yes    Alcohol/week: 10.0  standard drinks of alcohol    Types: 10 Glasses of wine per week   Drug use: Never   Sexual activity: Not on file  Other Topics Concern   Not on file  Social History Narrative   Not on file   Social Drivers of Health   Financial Resource Strain: Not at Risk (05/21/2023)   Received from [employer]    How hard is it for you to pay for the very basics like food, housing, heating, medical care, and medications?: 1  Food Insecurity: Not at Risk (05/21/2023)   Received from [facility] Insecurity    Within the past 12 months, you worried that your food would run out before you got money to buy more.: 1  Transportation Needs: Not at Risk (05/21/2023)   Received from [facility] Needs    In the past 12 months, has lack of transportation kept you from medical appointments, meetings, work or from getting things needed for daily living?: 1  Physical Activity: Not on File (08/25/2021)   Received from [facility]   Physical Activity    Physical Activity: 0  Stress: Not on File (08/25/2021)   Received from [facility]   Stress    Stress: 0  Social Connections: Not at Risk (05/21/2023)   Received from [facility]   Social Connections    How often do you feel lonely or isolated from those around you? : 1     Family History: The patient's family history includes Cancer in his paternal grandfather; Depression in his mother; Diabetes in his maternal grandfather; Hypercholesterolemia in his brother; Hypertension in his brother; Liver disease in his father. ROS:   Please see the history of present illness.    All 14 point review of systems negative except as described per history of present illness  EKGs/Labs/Other Studies Reviewed:         Recent Labs: No results found for requested labs within last 365 days.  Recent Lipid Panel    Component Value Date/Time   CHOL 138 12/05/2022 1417   TRIG 112 12/05/2022 1417   HDL 50 12/05/2022 1417   CHOLHDL 2.8 12/05/2022 1417    CHOLHDL 2.2 02/23/2021 0211   VLDL 11 02/23/2021 0211   LDLCALC 68 12/05/2022 1417    Physical Exam:    VS:  BP 120/70   Pulse 71   Ht 5' 8 (1.727 m)   Wt 255 lb (115.7 kg)   SpO2 98%   BMI 38.77 kg/m     Wt Readings from Last 3 Encounters:  03/19/24 255 lb (115.7 kg)  12/31/23 255 lb 14.4 oz (116.1 kg)  07/25/23 249 lb 9.6 oz (113.2 kg)     GEN:  Well nourished, well developed in no  acute distress HEENT: Normal NECK: No JVD; No carotid bruits LYMPHATICS: No lymphadenopathy CARDIAC: RRR, no murmurs, no rubs, no gallops RESPIRATORY:  Clear to auscultation without rales, wheezing or rhonchi  ABDOMEN: Soft, non-tender, non-distended MUSCULOSKELETAL:  No edema; No deformity  SKIN: Warm and dry LOWER EXTREMITIES: 1+ swelling NEUROLOGIC:  Alert and oriented x 3 PSYCHIATRIC:  Normal affect   ASSESSMENT:    1. Coronary artery disease involving native coronary artery of native heart without angina pectoris   2. Essential hypertension   3. Occlusion and stenosis of bilateral carotid arteries   4. Mixed hyperlipidemia    PLAN:    In order of problems listed above:  Coronary artery disease stable on guideline directed medical therapy asymptomatic continue present management. Essential hypertension blood pressure well-controlled continue present management Swelling of lower extremities.  Probably contributing is amlodipine , will stop this Dyazide put him on Lasix 20 next week we will check his fasting lipid profile as well as Chem-7 Dyslipidemia taking Lipitor  80 last blood test I have is from 2024 good control will repeat the test. Carotic arterial disease, scheduled to have test done in February   Medication Adjustments/Labs and Tests Ordered: Current medicines are reviewed at length with the patient today.  Concerns regarding medicines are outlined above.  No orders of the defined types were placed in this encounter.  Medication changes: No orders of the defined types  were placed in this encounter.   Signed, Lamar DOROTHA Fitch, MD, [facility] 03/19/2024 3:54 PM    [facility]

## 2024-03-27 LAB — LIPID PANEL
Chol/HDL Ratio: 3 ratio (ref 0.0–5.0)
Cholesterol, Total: 130 mg/dL (ref 100–199)
HDL: 44 mg/dL (ref >39)
LDL Chol Calc (NIH): 67 mg/dL (ref 0–99)
Triglycerides: 102 mg/dL (ref 0–149)
VLDL Cholesterol Cal: 19 mg/dL (ref 5–40)

## 2024-03-27 LAB — BASIC METABOLIC PANEL WITH GFR
BUN/Creatinine Ratio: 15 (ref 10–24)
BUN: 19 mg/dL (ref 8–27)
CO2: 23 mmol/L (ref 20–29)
Calcium: 9.7 mg/dL (ref 8.6–10.2)
Chloride: 101 mmol/L (ref 96–106)
Creatinine, Ser: 1.28 mg/dL — ABNORMAL HIGH (ref 0.76–1.27)
Glucose: 93 mg/dL (ref 70–99)
Potassium: 4.7 mmol/L (ref 3.5–5.2)
Sodium: 138 mmol/L (ref 134–144)
eGFR: 57 mL/min/1.73 — ABNORMAL LOW (ref >59)

## 2024-04-01 ENCOUNTER — Telehealth: Payer: Self-pay

## 2024-04-01 ENCOUNTER — Ambulatory Visit: Payer: Self-pay | Admitting: Cardiology

## 2024-04-01 DIAGNOSIS — I10 Essential (primary) hypertension: Secondary | ICD-10-CM

## 2024-04-01 NOTE — Addendum Note (Signed)
 Addended by: ARLOA PLANAS D on: 04/01/2024 04:03 PM   Modules accepted: Orders

## 2024-04-01 NOTE — Telephone Encounter (Signed)
 Left message on My Chart with normal results per Dr. Karry note. Routed to PCP.

## 2024-04-06 ENCOUNTER — Telehealth: Payer: Self-pay

## 2024-04-06 NOTE — Telephone Encounter (Signed)
 Pt viewed lab results on My Chart per Dr. Karry note. Routed to PCP.

## 2024-04-24 LAB — BASIC METABOLIC PANEL WITH GFR
BUN/Creatinine Ratio: 16 (ref 10–24)
BUN: 16 mg/dL (ref 8–27)
CO2: 25 mmol/L (ref 20–29)
Calcium: 9.3 mg/dL (ref 8.6–10.2)
Chloride: 100 mmol/L (ref 96–106)
Creatinine, Ser: 1.03 mg/dL (ref 0.76–1.27)
Glucose: 122 mg/dL — ABNORMAL HIGH (ref 70–99)
Potassium: 4 mmol/L (ref 3.5–5.2)
Sodium: 136 mmol/L (ref 134–144)
eGFR: 74 mL/min/1.73

## 2024-06-23 ENCOUNTER — Ambulatory Visit: Admitting: Vascular Surgery

## 2024-06-23 ENCOUNTER — Encounter (HOSPITAL_COMMUNITY)
